# Patient Record
Sex: Female | Born: 1950 | Race: Black or African American | Hispanic: No | Marital: Married | Smoking: Never smoker
Health system: Southern US, Community
[De-identification: ages and names within clinical notes are randomized; demographics above are authoritative.]

## PROBLEM LIST (undated history)

## (undated) DIAGNOSIS — E0500 Thyrotoxicosis with diffuse goiter without thyrotoxic crisis or storm: Secondary | ICD-10-CM

## (undated) DIAGNOSIS — K219 Gastro-esophageal reflux disease without esophagitis: Secondary | ICD-10-CM

## (undated) DIAGNOSIS — M1990 Unspecified osteoarthritis, unspecified site: Secondary | ICD-10-CM

## (undated) DIAGNOSIS — F419 Anxiety disorder, unspecified: Secondary | ICD-10-CM

## (undated) DIAGNOSIS — I499 Cardiac arrhythmia, unspecified: Secondary | ICD-10-CM

## (undated) DIAGNOSIS — C801 Malignant (primary) neoplasm, unspecified: Secondary | ICD-10-CM

## (undated) DIAGNOSIS — K529 Noninfective gastroenteritis and colitis, unspecified: Secondary | ICD-10-CM

## (undated) DIAGNOSIS — E039 Hypothyroidism, unspecified: Secondary | ICD-10-CM

## (undated) DIAGNOSIS — R52 Pain, unspecified: Secondary | ICD-10-CM

## (undated) DIAGNOSIS — B029 Zoster without complications: Secondary | ICD-10-CM

## (undated) HISTORY — PX: ABDOMINAL HYSTERECTOMY: SHX81

## (undated) HISTORY — PX: OTHER SURGICAL HISTORY: SHX169

## (undated) HISTORY — PX: PORTACATH PLACEMENT: SHX2246

## (undated) HISTORY — PX: TONSILLECTOMY: SUR1361

---

## 2012-11-10 HISTORY — PX: VIDEO ASSISTED THORACOSCOPY (VATS)/WEDGE RESECTION: SHX6174

## 2013-01-10 DIAGNOSIS — B029 Zoster without complications: Secondary | ICD-10-CM

## 2013-01-10 HISTORY — DX: Zoster without complications: B02.9

## 2013-02-14 ENCOUNTER — Other Ambulatory Visit (HOSPITAL_COMMUNITY): Payer: Self-pay | Admitting: Internal Medicine

## 2013-02-14 DIAGNOSIS — C3490 Malignant neoplasm of unspecified part of unspecified bronchus or lung: Secondary | ICD-10-CM

## 2013-02-15 ENCOUNTER — Ambulatory Visit (HOSPITAL_COMMUNITY)
Admission: RE | Admit: 2013-02-15 | Discharge: 2013-02-15 | Disposition: A | Discharge status: Home / Self Care | Payer: BC Managed Care – PPO | Source: Ambulatory Visit | Attending: Internal Medicine | Admitting: Internal Medicine

## 2013-02-15 ENCOUNTER — Other Ambulatory Visit (HOSPITAL_COMMUNITY): Payer: Self-pay | Admitting: Internal Medicine

## 2013-02-15 ENCOUNTER — Other Ambulatory Visit: Payer: Self-pay

## 2013-02-15 ENCOUNTER — Encounter (HOSPITAL_COMMUNITY): Payer: Self-pay

## 2013-02-15 DIAGNOSIS — Z79899 Other long term (current) drug therapy: Secondary | ICD-10-CM | POA: Insufficient documentation

## 2013-02-15 DIAGNOSIS — Z90710 Acquired absence of both cervix and uterus: Secondary | ICD-10-CM | POA: Insufficient documentation

## 2013-02-15 DIAGNOSIS — R000 Tachycardia, unspecified: Secondary | ICD-10-CM | POA: Insufficient documentation

## 2013-02-15 DIAGNOSIS — Z9089 Acquired absence of other organs: Secondary | ICD-10-CM | POA: Insufficient documentation

## 2013-02-15 DIAGNOSIS — C3490 Malignant neoplasm of unspecified part of unspecified bronchus or lung: Secondary | ICD-10-CM

## 2013-02-15 HISTORY — DX: Malignant (primary) neoplasm, unspecified: C80.1

## 2013-02-15 LAB — CBC WITH DIFFERENTIAL/PLATELET
Basophils Absolute: 0 10*3/uL (ref 0.0–0.1)
Basophils Relative: 0 % (ref 0–1)
EOS PCT: 3 % (ref 0–5)
Eosinophils Absolute: 0.1 10*3/uL (ref 0.0–0.7)
HEMATOCRIT: 35.4 % — AB (ref 36.0–46.0)
HEMOGLOBIN: 11.9 g/dL — AB (ref 12.0–15.0)
LYMPHS ABS: 2.1 10*3/uL (ref 0.7–4.0)
Lymphocytes Relative: 47 % — ABNORMAL HIGH (ref 12–46)
MCH: 29.4 pg (ref 26.0–34.0)
MCHC: 33.6 g/dL (ref 30.0–36.0)
MCV: 87.4 fL (ref 78.0–100.0)
MONOS PCT: 14 % — AB (ref 3–12)
Monocytes Absolute: 0.6 10*3/uL (ref 0.1–1.0)
NEUTROS PCT: 36 % — AB (ref 43–77)
Neutro Abs: 1.6 10*3/uL — ABNORMAL LOW (ref 1.7–7.7)
Platelets: 216 10*3/uL (ref 150–400)
RBC: 4.05 MIL/uL (ref 3.87–5.11)
RDW: 12.7 % (ref 11.5–15.5)
WBC: 4.4 10*3/uL (ref 4.0–10.5)

## 2013-02-15 LAB — PROTIME-INR
INR: 1.14 (ref 0.00–1.49)
PROTHROMBIN TIME: 14.4 s (ref 11.6–15.2)

## 2013-02-15 LAB — APTT: aPTT: 28 seconds (ref 24–37)

## 2013-02-15 MED ORDER — MIDAZOLAM HCL 2 MG/2ML IJ SOLN
INTRAMUSCULAR | Status: AC
  Filled 2013-02-15: qty 6

## 2013-02-15 MED ORDER — LIDOCAINE HCL 1 % IJ SOLN
INTRAMUSCULAR | Status: AC
  Filled 2013-02-15: qty 20

## 2013-02-15 MED ORDER — SODIUM CHLORIDE 0.9 % IV SOLN
INTRAVENOUS | Status: DC
  Administered 2013-02-15: 13:00:00 via INTRAVENOUS

## 2013-02-15 MED ORDER — FENTANYL CITRATE 0.05 MG/ML IJ SOLN
INTRAMUSCULAR | Status: AC | PRN
  Administered 2013-02-15 (×2): 25 ug via INTRAVENOUS
  Administered 2013-02-15: 50 ug via INTRAVENOUS

## 2013-02-15 MED ORDER — HEPARIN SOD (PORK) LOCK FLUSH 100 UNIT/ML IV SOLN
INTRAVENOUS | Status: AC
  Filled 2013-02-15: qty 5

## 2013-02-15 MED ORDER — MIDAZOLAM HCL 2 MG/2ML IJ SOLN
INTRAMUSCULAR | Status: AC | PRN
  Administered 2013-02-15: 1 mg via INTRAVENOUS
  Administered 2013-02-15 (×5): 0.5 mg via INTRAVENOUS

## 2013-02-15 MED ORDER — CEFAZOLIN SODIUM-DEXTROSE 2-3 GM-% IV SOLR
2.0000 g | INTRAVENOUS | Status: AC
  Administered 2013-02-15: 2 g via INTRAVENOUS
  Filled 2013-02-15: qty 50

## 2013-02-15 MED ORDER — HEPARIN SOD (PORK) LOCK FLUSH 100 UNIT/ML IV SOLN
INTRAVENOUS | Status: AC | PRN
  Administered 2013-02-15: 500 [IU]

## 2013-02-15 MED ORDER — FENTANYL CITRATE 0.05 MG/ML IJ SOLN
INTRAMUSCULAR | Status: AC
  Filled 2013-02-15: qty 6

## 2013-02-15 NOTE — Procedures (Signed)
Procedure:  Porta-cath placement Findings:  Right IJ single lumen Power Port placed.  Cath tip at cavoatrial junction.  No PTX.  OK to use.

## 2013-02-15 NOTE — H&P (Signed)
Chief Complaint: "I'm here for port placement" Referring Physician:Darovsky HPI: Katherine Ferguson is an 63 y.o. female with lung cancer. She has had surgical resection on the right side at [facility], but is going to start chemotherapy here. She does not report plans for radiation therapy. She is referred for port placement. PMHx and meds reviewed. No recent illness, fevers, chills. She has recovered well from surgery.  Past Medical History:  Past Medical History  Diagnosis Date  . Cancer     lung    Past Surgical History:  Past Surgical History  Procedure Laterality Date  . Abdominal hysterectomy    . Tonsillectomy    . Video assisted thoracoscopy (vats)/wedge resection Right 11/2012  . Mass removed  from back and neck      Family History: No family history on file.  Social History:  reports that she has never smoked. She does not have any smokeless tobacco history on file. She reports that she does not use illicit drugs. Her alcohol history is not on file.  Allergies: No Known Allergies  Medications:   Medication List    ASK your doctor about these medications       acetaminophen 500 MG tablet  Commonly known as:  TYLENOL  Take 500 mg by mouth every 6 (six) hours as needed for mild pain.     diazepam 5 MG tablet  Commonly known as:  VALIUM  Take 5 mg by mouth every 6 (six) hours as needed for anxiety.     oxycodone 5 MG capsule  Commonly known as:  OXY-IR  Take 5-10 mg by mouth every 4 (four) hours as needed for pain.        Please HPI for pertinent positives, otherwise complete 10 system ROS negative.  Physical Exam: BP 162/65  Pulse 132  Temp(Src) 98 F (36.7 C) (Oral)  Resp 20  Ht 5\' 2"  (1.575 m)  Wt 121 lb (54.885 kg)  BMI 22.13 kg/m2  SpO2 100% Body mass index is 22.13 kg/(m^2).   General Appearance:  Alert, cooperative, no distress, appears stated age  Head:  Normocephalic, without obvious abnormality, atraumatic  ENT: Unremarkable  Neck: Supple,  symmetrical, trachea midline  Lungs:   Clear to auscultation bilaterally, no w/r/r, respirations unlabored without use of accessory muscles.  Chest Wall:  No tenderness or deformity. Healed surgical scars from VATS  Heart:  Tachy but regular rate and rhythm, S1, S2 normal, no murmur, rub or gallop.  Abdomen:   Soft, non-tender, non distended.  Extremities: Extremities normal, atraumatic, no cyanosis or edema  Pulses: 2+ and symmetric  Neurologic: Normal affect, no gross deficits.   Results for orders placed during the hospital encounter of 02/15/13 (from the past 48 hour(s))  CBC WITH DIFFERENTIAL     Status: Abnormal   Collection Time    02/15/13  1:15 PM      Result Value Range   WBC 4.4  4.0 - 10.5 K/uL   RBC 4.05  3.87 - 5.11 MIL/uL   Hemoglobin 11.9 (*) 12.0 - 15.0 g/dL   HCT 35.4 (*) 36.0 - 46.0 %   MCV 87.4  78.0 - 100.0 fL   MCH 29.4  26.0 - 34.0 pg   MCHC 33.6  30.0 - 36.0 g/dL   RDW 12.7  11.5 - 15.5 %   Platelets 216  150 - 400 K/uL   Neutrophils Relative % 36 (*) 43 - 77 %   Neutro Abs 1.6 (*) 1.7 - 7.7 K/uL  Lymphocytes Relative 47 (*) 12 - 46 %   Lymphs Abs 2.1  0.7 - 4.0 K/uL   Monocytes Relative 14 (*) 3 - 12 %   Monocytes Absolute 0.6  0.1 - 1.0 K/uL   Eosinophils Relative 3  0 - 5 %   Eosinophils Absolute 0.1  0.0 - 0.7 K/uL   Basophils Relative 0  0 - 1 %   Basophils Absolute 0.0  0.0 - 0.1 K/uL  APTT     Status: None   Collection Time    02/15/13  1:15 PM      Result Value Range   aPTT 28  24 - 37 seconds  PROTIME-INR     Status: None   Collection Time    02/15/13  1:15 PM      Result Value Range   Prothrombin Time 14.4  11.6 - 15.2 seconds   INR 1.14  0.00 - 1.49   No results found.  Assessment/Plan Lung cancer For port placement Explained procedure, risks, complications, use of sedation. Labs pending Consent signed in chart  Ascencion Dike PA-C 02/15/2013, 1:42 PM

## 2013-02-15 NOTE — H&P (Signed)
Agree. For port placement today.

## 2013-02-15 NOTE — Discharge Instructions (Signed)
Implanted Port Home Guide °An implanted port is a type of central line that is placed under the skin. Central lines are used to provide IV access when treatment or nutrition needs to be given through a person's veins. Implanted ports are used for long-term IV access. An implanted port may be placed because:  °· You need IV medicine that would be irritating to the small veins in your hands or arms.   °· You need long-term IV medicines, such as antibiotics.   °· You need IV nutrition for a long period.   °· You need frequent blood draws for lab tests.   °· You need dialysis.   °Implanted ports are usually placed in the chest area, but they can also be placed in the upper arm, the abdomen, or the leg. An implanted port has two main parts:  °· Reservoir. The reservoir is round and will appear as a small, raised area under your skin. The reservoir is the part where a needle is inserted to give medicines or draw blood.   °· Catheter. The catheter is a thin, flexible tube that extends from the reservoir. The catheter is placed into a large vein. Medicine that is inserted into the reservoir goes into the catheter and then into the vein.   °HOW WILL I CARE FOR MY INCISION SITE? °Do not get the incision site wet. Bathe or shower as directed by your health care provider.  °HOW IS MY PORT ACCESSED? °Special steps must be taken to access the port:  °· Before the port is accessed, a numbing cream can be placed on the skin. This helps numb the skin over the port site.   °· Your health care provider uses a sterile technique to access the port. °· Your health care provider must put on a mask and sterile gloves. °· The skin over your port is cleaned carefully with an antiseptic and allowed to dry. °· The port is gently pinched between sterile gloves, and a needle is inserted into the port. °· Only "non-coring" port needles should be used to access the port. Once the port is accessed, a blood return should be checked. This helps  ensure that the port is in the vein and is not clogged.   °· If your port needs to remain accessed for a constant infusion, a clear (transparent) bandage will be placed over the needle site. The bandage and needle will need to be changed every week, or as directed by your health care provider.   °· Keep the bandage covering the needle clean and dry. Do not get it wet. Follow your health care provider's instructions on how to take a shower or bath while the port is accessed.   °· If your port does not need to stay accessed, no bandage is needed over the port.   °WHAT IS FLUSHING? °Flushing helps keep the port from getting clogged. Follow your health care provider's instructions on how and when to flush the port. Ports are usually flushed with saline solution or a medicine called heparin. The need for flushing will depend on how the port is used.  °· If the port is used for intermittent medicines or blood draws, the port will need to be flushed:   °· After medicines have been given.   °· After blood has been drawn.   °· As part of routine maintenance.   °· If a constant infusion is running, the port may not need to be flushed.   °HOW LONG WILL MY PORT STAY IMPLANTED? °The port can stay in for as long as your health care   provider thinks it is needed. When it is time for the port to come out, surgery will be done to remove it. The procedure is similar to the one performed when the port was put in.  °WHEN SHOULD I SEEK IMMEDIATE MEDICAL CARE? °When you have an implanted port, you should seek immediate medical care if:  °· You notice a bad smell coming from the incision site.   °· You have swelling, redness, or drainage at the incision site.   °· You have more swelling or pain at the port site or the surrounding area.   °· You have a fever that is not controlled with medicine. °Document Released: 12/27/2004 Document Revised: 10/17/2012 Document Reviewed: 09/03/2012 °ExitCare® Patient Information ©2014 [employer]  LL[employer]. °Moderate Sedation, Adult °Moderate sedation is given to help you relax or even sleep through a procedure. You may remain sleepy, be clumsy, or have poor balance for several hours following this procedure. Arrange for a responsible adult, family member, or friend to take you home. A responsible adult should stay with you for at least 24 hours or until the medicines have worn off. °· Do not participate in any activities where you could become injured for the next 24 hours, or until you feel normal again. Do not: °· Drive. °· Swim. °· Ride a bicycle. °· Operate heavy machinery. °· Cook. °· Use power tools. °· Climb ladders. °· Work at heights. °· Do not make important decisions or sign legal documents until you are improved. °· Vomiting may occur if you eat too soon. When you can drink without vomiting, try water, juice, or soup. Try solid foods if you feel little or no nausea. °· Only take over-the-counter or prescription medications for pain, discomfort, or fever as directed by your caregiver.If pain medications have been prescribed for you, ask your caregiver how soon it is safe to take them. °· Make sure you and your family fully understands everything about the medication given to you. Make sure you understand what side effects may occur. °· You should not drink alcohol, take sleeping pills, or medications that cause drowsiness for at least 24 hours. °· If you smoke, do not smoke alone. °· If you are feeling better, you may resume normal activities 24 hours after receiving sedation. °· Keep all appointments as scheduled. Follow all instructions. °· Ask questions if you do not understand. °SEEK MEDICAL CARE IF:  °· Your skin is pale or bluish in color. °· You continue to feel sick to your stomach (nauseous) or throw up (vomit). °· Your pain is getting worse and not helped by medication. °· You have bleeding or swelling. °· You are still sleepy or feeling clumsy after 24 hours. °SEEK IMMEDIATE MEDICAL CARE IF:   °· You develop a rash. °· You have difficulty breathing. °· You develop any type of allergic problem. °· You have a fever. °Document Released: 09/21/2000 Document Revised: 03/21/2011 Document Reviewed: 09/03/2012 °ExitCare® Patient Information ©2014 [employer]. ° °

## 2013-09-26 ENCOUNTER — Encounter (INDEPENDENT_AMBULATORY_CARE_PROVIDER_SITE_OTHER): Payer: Self-pay | Admitting: *Deleted

## 2013-10-01 ENCOUNTER — Ambulatory Visit (INDEPENDENT_AMBULATORY_CARE_PROVIDER_SITE_OTHER): Payer: Medicare Other | Admitting: Internal Medicine

## 2013-10-01 ENCOUNTER — Encounter (INDEPENDENT_AMBULATORY_CARE_PROVIDER_SITE_OTHER): Payer: Self-pay | Admitting: Internal Medicine

## 2013-10-01 VITALS — BP 122/66 | HR 68 | Temp 97.7°F | Resp 16 | Ht 62.0 in | Wt 119.1 lb

## 2013-10-01 DIAGNOSIS — R197 Diarrhea, unspecified: Secondary | ICD-10-CM

## 2013-10-01 DIAGNOSIS — K529 Noninfective gastroenteritis and colitis, unspecified: Secondary | ICD-10-CM

## 2013-10-01 DIAGNOSIS — E0500 Thyrotoxicosis with diffuse goiter without thyrotoxic crisis or storm: Secondary | ICD-10-CM | POA: Insufficient documentation

## 2013-10-01 NOTE — Patient Instructions (Signed)
Take Imodium OTC 2 mg by mouth daily. Stool diary until time of colonoscopy.

## 2013-10-01 NOTE — Progress Notes (Signed)
Presenting complaint;  Evaluation for chronic diarrhea.  History of present illness;  Patient is 63 year old [place] American female who is referred through the courtesy of Dr. Tressie Stalker for GI evaluation. Patient is under his care for metastatic lung carcinoma receiving chemotherapy as reviewed under past medical history. 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 Patient states all her problems began in November 2014. She began to have watery diarrhea with as many as 10-15 stools per day. She had multiple accidents. She experienced intermittent cramps. She did not have fever or chills melena or rectal bleeding. She also did not experience nausea or vomiting. There is no history of antibiotic use prior to onset of her diarrhea. There is also no history of travel abroad. Patient also has noted change in order to her stool and excessive flatus. Stool studies were negative. She was seen by Dr. Doristine Mango and had another stool C. difficile which was negative and she was advised to take Imodium along with Questran. She is using Questran twice daily and Imodium on as-needed basis and now having 5-6 watery to soft stools daily. She says she lost weight from 142 pounds down to 94 pounds. She was begun on Megace and has gained 25 pounds. Megace was discontinued after 2 months. She believes her weight loss was multi-factorial since she was also diagnosed with hypothyroidism and receiving chemotherapy for lung carcinoma. She also reports diarrhea getting worse 1 day after receiving chemotherapy and lasting for about 4 days before she is back to her baseline. He denies nausea vomiting heartburn or dysphagia. Patient states her bowels would move every other day prior to onset of her diarrhea. Last colonoscopy was 10 years ago by Dr. Valentino Saxon and was normal.   Current Medications: Outpatient Encounter Prescriptions as of 10/01/2013  Medication Sig  . acetaminophen (TYLENOL) 500 MG  tablet Take 500 mg by mouth every 6 (six) hours as needed for mild pain.  . calcium carbonate (OS-CAL) 600 MG TABS tablet Take 600 mg by mouth daily.   . Cholecalciferol (VITAMIN D) 2000 UNITS tablet Take 2,000 Units by mouth daily.   . cholestyramine (QUESTRAN) 4 GM/DOSE powder Take 4 g by mouth 2 (two) times daily with a meal.   . dexamethasone (DECADRON) 4 MG tablet Take 4 mg by mouth daily.   . diazepam (VALIUM) 5 MG tablet Take 5 mg by mouth every 6 (six) hours as needed for anxiety.  . folic acid (FOLVITE) 1 MG tablet Take 1 mg by mouth daily.   Marland Kitchen lidocaine-prilocaine (EMLA) cream Apply 1 application topically every 14 (fourteen) days.   Marland Kitchen loperamide (IMODIUM) 2 MG capsule Take 2 mg by mouth as needed.   . methimazole (TAPAZOLE) 10 MG tablet Take 5 mg by mouth daily. Take 1/2 tablet daily(5 mg)  . metoprolol (LOPRESSOR) 50 MG tablet Take by mouth 2 (two) times daily.   . Multiple Vitamin (THERA) TABS Take 1 tablet by mouth daily.   . naproxen sodium (ALEVE) 220 MG tablet Take 440 mg by mouth as needed.   Marland Kitchen oxyCODONE (OXY IR/ROXICODONE) 5 MG immediate release tablet Take 5 mg by mouth every 4 (four) hours as needed.   Marland Kitchen oxycodone (OXY-IR) 5 MG capsule Take 5-10 mg by mouth every 4 (four) hours as needed for pain.  . potassium chloride (K-DUR) 10 MEQ tablet Take 20 mEq by mouth 2 (two) times daily.   . [DISCONTINUED] diazepam (VALIUM) 5 MG tablet Take 5 mg by mouth.   Past  medical history; History of osteoporosis. Thyrotoxicosis or Graves' disease. She is under care of Dr. Alton Revere of [facility] endocrinology. Metastatic lung carcinoma. She initially presented in November 2014. Tissue diagnosis was made at [facility] by lung biopsy. She received 6 cycles of carboplatinum and pemetrexed. After initial response disease became progressive and she was switched to Novilumab every 6 weeks and she's had 4 cycles. Status post hysterectomy.  Allergies; No Known Allergies  Family history; Father died of  pancreatic carcinoma at age 75 within 2 weeks of diagnosis. Mother died of ovarian cancer at age 52. She has 2 brothers and they're both in good health(66and 24).  Social history; Patient is married and accompanied by her husband today. 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 She has 2 grownup children in good health. She does not smoke cigarettes or drink alcohol.   Physical examination; Blood pressure 122/66, pulse 68, temperature 97.7 F (36.5 C), temperature source Oral, resp. rate 16, height 5\' 2"  (1.575 m), weight 119 lb 1.6 oz (54.023 kg). Patient is alert and in no acute distress. Conjunctiva is pink. Sclera is nonicteric Oropharyngeal mucosa is normal. No neck masses or thyromegaly noted. Cardiac exam with regular rhythm normal S1 and S2. No murmur or gallop noted. Lungs are clear to auscultation. Abdomen is symmetrical. Bowel sounds are hyperactive. No bruits noted. On palpation abdomen is soft and nontender without organomegaly or masses. No LE edema or clubbing noted.  Labs/studies Results: Lab data from 09/17/2013. WBC 6.5, H&H 12.4 and 37.5 and platelet count 236K  Serum sodium 136, potassium 3.8, chloride 103, CO2 25, serum magnesium 1.8.  Glucose 146, BUN 10 and creatinine 0.73  Bilirubin 0.4, AP 113, AST 27, ALT 29, total protein 7.1 and albumin of 4.4 and serum calcium 9.3. TSH 2.70  FT4 0.77  Serum amylase 68 and lipase 27   Abdominopelvic CT from 07/29/2013 without contrast revealed no abnormality to pancreas or liver. No wall thickening noted the small or large bowel. There was focal fatty area in anterior abdominal wall on the right side felt to be small intramuscular lipoma   Chest CT on 09/10/2013 revealed bilateral pulmonary metastatic disease  With a minimal progression since prior study of 07/16/2013 along with new bone lesions involving several thoracic vertebral bodies.   Assessment:  Patient is 63 year old African  female who presents with 10 month history of nonbloody diarrhea with negative stool studies and partial symptomatic improvement with Questran and loperamide. Patient's illness also been complicated by metastatic adenocarcinoma which is felt to be from lung primary as well as history of thyrotoxicosis controlled with methimazole. There has been significant weight loss but it cannot be attributed solely to diarrhea. Diarrhea does not have features of malabsorptive syndrome. She could have paraneoplastic syndrome but first need to rule out conditions like microscopic or collagenous colitis. She may also need EGD with small bowel biopsy if colonoscopy is normal. If endoscopic evaluation is unremarkable she may need stool studies to determine if she has secretive diarrhea.  Recommendations;  Patient advised to take loperamide 2 mg by mouth every morning rather than on when necessary basis. Will schedule patient for colonoscopy and possible EGD in the near future. Further recommendations to follow.

## 2013-10-07 ENCOUNTER — Encounter (HOSPITAL_COMMUNITY): Payer: Self-pay | Admitting: Pharmacy Technician

## 2013-10-07 ENCOUNTER — Telehealth (INDEPENDENT_AMBULATORY_CARE_PROVIDER_SITE_OTHER): Payer: Self-pay | Admitting: *Deleted

## 2013-10-07 ENCOUNTER — Other Ambulatory Visit (INDEPENDENT_AMBULATORY_CARE_PROVIDER_SITE_OTHER): Payer: Self-pay | Admitting: *Deleted

## 2013-10-07 DIAGNOSIS — R197 Diarrhea, unspecified: Secondary | ICD-10-CM

## 2013-10-07 DIAGNOSIS — Z1211 Encounter for screening for malignant neoplasm of colon: Secondary | ICD-10-CM

## 2013-10-07 MED ORDER — PEG-KCL-NACL-NASULF-NA ASC-C 100 G PO SOLR: 1.0000 | Freq: Once | ORAL | Status: DC

## 2013-10-07 NOTE — Telephone Encounter (Signed)
Patient needs movi prep 

## 2013-10-09 ENCOUNTER — Telehealth (INDEPENDENT_AMBULATORY_CARE_PROVIDER_SITE_OTHER): Payer: Self-pay | Admitting: *Deleted

## 2013-10-09 NOTE — Telephone Encounter (Signed)
Patient aware.

## 2013-10-09 NOTE — Telephone Encounter (Signed)
Patient wants to know if she needs to continue to take the Imodium & Cholestyramine for her diarrhea -- she couldn't remember if you told anything about theses medications at the office visit -- please advise

## 2013-10-09 NOTE — Telephone Encounter (Signed)
Patient can hold these medications when she takes the prep day before. Would start her back on these meds day of procedure.

## 2013-10-11 ENCOUNTER — Ambulatory Visit (HOSPITAL_COMMUNITY)
Admission: RE | Admit: 2013-10-11 | Discharge: 2013-10-11 | Disposition: A | Discharge status: Home / Self Care | Payer: Medicare Other | Source: Ambulatory Visit | Attending: Internal Medicine | Admitting: Internal Medicine

## 2013-10-11 ENCOUNTER — Encounter (HOSPITAL_COMMUNITY)
Admission: RE | Disposition: A | Discharge status: Home / Self Care | Payer: Self-pay | Source: Ambulatory Visit | Attending: Internal Medicine

## 2013-10-11 ENCOUNTER — Encounter (HOSPITAL_COMMUNITY): Payer: Self-pay

## 2013-10-11 DIAGNOSIS — R634 Abnormal weight loss: Secondary | ICD-10-CM | POA: Insufficient documentation

## 2013-10-11 DIAGNOSIS — K635 Polyp of colon: Secondary | ICD-10-CM

## 2013-10-11 DIAGNOSIS — R197 Diarrhea, unspecified: Secondary | ICD-10-CM | POA: Insufficient documentation

## 2013-10-11 DIAGNOSIS — C3490 Malignant neoplasm of unspecified part of unspecified bronchus or lung: Secondary | ICD-10-CM

## 2013-10-11 DIAGNOSIS — Z79899 Other long term (current) drug therapy: Secondary | ICD-10-CM | POA: Diagnosis not present

## 2013-10-11 DIAGNOSIS — K449 Diaphragmatic hernia without obstruction or gangrene: Secondary | ICD-10-CM | POA: Diagnosis not present

## 2013-10-11 DIAGNOSIS — K529 Noninfective gastroenteritis and colitis, unspecified: Secondary | ICD-10-CM

## 2013-10-11 DIAGNOSIS — K2210 Ulcer of esophagus without bleeding: Secondary | ICD-10-CM | POA: Insufficient documentation

## 2013-10-11 DIAGNOSIS — D123 Benign neoplasm of transverse colon: Secondary | ICD-10-CM | POA: Insufficient documentation

## 2013-10-11 HISTORY — PX: COLONOSCOPY: SHX5424

## 2013-10-11 HISTORY — PX: ESOPHAGOGASTRODUODENOSCOPY: SHX5428

## 2013-10-11 SURGERY — COLONOSCOPY
Anesthesia: Moderate Sedation

## 2013-10-11 MED ORDER — MIDAZOLAM HCL 5 MG/5ML IJ SOLN
INTRAMUSCULAR | Status: AC
  Filled 2013-10-11: qty 10

## 2013-10-11 MED ORDER — MEPERIDINE HCL 50 MG/ML IJ SOLN
INTRAMUSCULAR | Status: DC | PRN
  Administered 2013-10-11 (×2): 25 mg via INTRAVENOUS

## 2013-10-11 MED ORDER — MIDAZOLAM HCL 5 MG/5ML IJ SOLN
INTRAMUSCULAR | Status: DC | PRN
  Administered 2013-10-11 (×2): 1 mg via INTRAVENOUS
  Administered 2013-10-11: 2 mg via INTRAVENOUS
  Administered 2013-10-11: 1 mg via INTRAVENOUS

## 2013-10-11 MED ORDER — SODIUM CHLORIDE 0.9 % IV SOLN
INTRAVENOUS | Status: DC
  Administered 2013-10-11: 12:00:00 via INTRAVENOUS

## 2013-10-11 MED ORDER — BUTAMBEN-TETRACAINE-BENZOCAINE 2-2-14 % EX AERO
INHALATION_SPRAY | CUTANEOUS | Status: DC | PRN
  Administered 2013-10-11: 1 via TOPICAL

## 2013-10-11 MED ORDER — STERILE WATER FOR IRRIGATION IR SOLN
Status: DC | PRN
  Administered 2013-10-11: 12:00:00

## 2013-10-11 MED ORDER — MEPERIDINE HCL 50 MG/ML IJ SOLN
INTRAMUSCULAR | Status: AC
  Filled 2013-10-11: qty 1

## 2013-10-11 MED ORDER — LOPERAMIDE HCL 2 MG PO CAPS: 2.0000 mg | ORAL_CAPSULE | Freq: Two times a day (BID) | ORAL | Status: DC

## 2013-10-11 MED ORDER — PANTOPRAZOLE SODIUM 40 MG PO TBEC: 40.0000 mg | DELAYED_RELEASE_TABLET | Freq: Every day | ORAL | Status: AC

## 2013-10-11 NOTE — Op Note (Signed)
[facility] [address] [place], [address]   COLONOSCOPY PROCEDURE REPORT     EXAM DATE: 10/23/2013  PATIENT NAME:      Katherine Ferguson, Katherine Ferguson           MR #:      [id number]  BIRTHDATE:       1950/07/17      VISIT #:     [id number]  ATTENDING:     Hildred Laser, MD     STATUS:     outpatient REFERRING MD:      Everardo All, M.D. ASA CLASS:        Class I  INDICATIONS:  The patient is a 63 yr old female here for a colonoscopy due to chronic diarrhea and weight loss.  patient has adenocarcinoma of the lung and is undergoing therapy. PROCEDURE PERFORMED:     Colonoscopy, diagnostic and Colonoscopy with biopsy MEDICATIONS:        None other than what was given for EGD. ESTIMATED BLOOD LOSS:     None  CONSENT: The patient understands the risks and benefits of the procedure and understands that these risks include, but are not limited to: sedation, allergic reaction, infection, perforation and/or bleeding. Alternative means of evaluation and treatment include, among others: physical exam, x-rays, and/or surgical intervention. The patient elects to proceed with this endoscopic procedure.  DESCRIPTION OF PROCEDURE: During intra-op preparation period all mechanical & medical equipment was checked for proper function. Hand hygiene and appropriate measures for infection prevention was taken. After the risks, benefits and alternatives of the procedure were thoroughly explained, Informed consent was verified, confirmed and timeout was successfully executed by the treatment team. A digital exam revealed no abnormalities of the rectum.      The [id number] ([id number]) endoscope was introduced through the anus and advanced to the cecum, which was identified by both the appendix and ileocecal valve. No adverse events experienced. The prep was excellent.. The instrument was then slowly withdrawn as the colon was fully examined.   COLON FINDINGS: A smooth polyp measuring  4 mm in size was found at the splenic flexure.  Multiple biopsies were performed using cold forceps.   The examination was otherwise normal. thickened anoderm. The scope was then completely withdrawn from the patient and the procedure terminated. WITHDRAWAL TIME: 12 minutes 0 seconds    ADVERSE EVENTS:      There were no immediate complications.  IMPRESSIONS:     1.  Polyp was found at the splenic flexure; multiple biopsies were performed using cold forceps 2.  The examination was otherwise normal .the random biopsies taken from mucosa of sigmoid colon looking for microscopic or collagenous colitis.  RECOMMENDATIONS:     1.  Await biopsy results 2.  Increase loperamide 2 mg by mouth twice a day   Hildred Laser, MD eSigned:  Hildred Laser, MD 23-Oct-2013 4:23 PM   cc:  CPT CODES: ICD CODES:  The ICD and CPT codes recommended by this software are interpretations from the data that the clinical staff has captured with the software.  The verification of the translation of this report to the ICD and CPT codes and modifiers is the sole responsibility of the health care institution and practicing physician where this report was generated.  [employer] will not be held responsible for the validity of the ICD and CPT codes included on this report.  AMA assumes no liability for data contained or not contained herein. CPT is a registered  trademark of the [employer].

## 2013-10-11 NOTE — H&P (Signed)
Katherine Ferguson is an 63 y.o. female.   Chief Complaint: Patient is here for EGD and colonoscopy. HPI: Patient is 63 year old African female with history of metastatic lung carcinoma presents with chronic diarrhea. C. difficile has been negative. She has noted mild epigastric and right lower quadrant pain. She has not responded to Imodium. Diarrhea is not felt to be secondary to chemotherapy. Please refer to my possible from 10/01/2013 for details.  Past Medical History  Diagnosis Date  . Cancer     lung    Past Surgical History  Procedure Laterality Date  . Abdominal hysterectomy    . Tonsillectomy    . Video assisted thoracoscopy (vats)/wedge resection Right 11/2012  . Mass removed  from back and neck      History reviewed. No pertinent family history. Social History:  reports that she has never smoked. She has never used smokeless tobacco. She reports that she does not drink alcohol or use illicit drugs.  Allergies:  Allergies  Allergen Reactions  . Iodine   . Iodides Rash    Medications Prior to Admission  Medication Sig Dispense Refill  . calcium carbonate (OS-CAL) 600 MG TABS tablet Take 600 mg by mouth daily.       . Cholecalciferol (VITAMIN D) 2000 UNITS tablet Take 2,000 Units by mouth daily.       . cholestyramine (QUESTRAN) 4 GM/DOSE powder Take 4 g by mouth 2 (two) times daily with a meal.       . Cyanocobalamin (VITAMIN B-12 IJ) Inject 1 inch as directed.      Marland Kitchen dexamethasone (DECADRON) 4 MG tablet Take 4 mg by mouth 2 (two) times daily. Take 2 days before, the day of, and 2 days after chemo.      . diazepam (VALIUM) 5 MG tablet Take 5 mg by mouth every 6 (six) hours as needed for anxiety.      . folic acid (FOLVITE) 1 MG tablet Take 1 mg by mouth daily.       Marland Kitchen lidocaine-prilocaine (EMLA) cream Apply 1 application topically every 14 (fourteen) days.       Marland Kitchen loperamide (IMODIUM) 2 MG capsule Take 2 mg by mouth as needed for diarrhea or loose stools.       Marland Kitchen  LORazepam (ATIVAN) 0.5 MG tablet Take 0.5 mg by mouth every 8 (eight) hours as needed for anxiety.      . methimazole (TAPAZOLE) 10 MG tablet Take 5 mg by mouth daily.       . metoprolol (LOPRESSOR) 50 MG tablet Take 50 mg by mouth 2 (two) times daily.       . Multiple Vitamin (THERA) TABS Take 1 tablet by mouth daily.       Marland Kitchen oxycodone (OXY-IR) 5 MG capsule Take 5-10 mg by mouth every 4 (four) hours as needed for pain.      . peg 3350 powder (MOVIPREP) 100 G SOLR Take 1 kit (200 g total) by mouth once.  1 kit  0  . potassium chloride (K-DUR) 10 MEQ tablet Take 20 mEq by mouth 4 (four) times daily.         No results found for this or any previous visit (from the past 48 hour(s)). No results found.  ROS  Blood pressure 138/73, pulse 72, temperature 97.7 F (36.5 C), temperature source Oral, resp. rate 21, SpO2 99.00%. Physical Exam  Constitutional: She appears well-developed and well-nourished.  HENT:  Mouth/Throat: Oropharynx is clear and moist.  Eyes: Conjunctivae are  normal. No scleral icterus.  Neck: No thyromegaly present.  Cardiovascular: Normal rate, regular rhythm and normal heart sounds.   No murmur heard. Respiratory: Effort normal and breath sounds normal.  GI: Soft. Tenderness: mild midepigastric and right lower quadrant tenderness.  Musculoskeletal: She exhibits no edema.  Lymphadenopathy:    She has no cervical adenopathy.  Neurological: She is alert.  Skin: Skin is warm and dry.     Assessment/Plan Chronic diarrhea with weight loss. [name]. Diagnostic EGD and colonoscopy.  KatherineNAJEEB Ferguson 10/11/2013, 12:07 PM

## 2013-10-11 NOTE — Discharge Instructions (Signed)
Resume usual medications but increase Imodium to 2 mg before breakfast and lunch daily. Pantoprazole 40 mg by mouth 30 minutes before breakfast daily. Resume usual diet. No driving for 36-UYQIH. Physician will call with results of blood test and biopsy.    Colonoscopy, Care After Refer to this sheet in the next few weeks. These instructions provide you with information on caring for yourself after your procedure. Your health care provider may also give you more specific instructions. Your treatment has been planned according to current medical practices, but problems sometimes occur. Call your health care provider if you have any problems or questions after your procedure. WHAT TO EXPECT AFTER THE PROCEDURE  After your procedure, it is typical to have the following:  A small amount of blood in your stool.  Moderate amounts of gas and mild abdominal cramping or bloating. HOME CARE INSTRUCTIONS  Do not drive, operate machinery, or sign important documents for 24 hours.  You may shower and resume your regular physical activities, but move at a slower pace for the first 24 hours.  Take frequent rest periods for the first 24 hours.  Walk around or put a warm pack on your abdomen to help reduce abdominal cramping and bloating.  Drink enough fluids to keep your urine clear or pale yellow.  You may resume your normal diet as instructed by your health care provider. Avoid heavy or fried foods that are hard to digest.  Avoid drinking alcohol for 24 hours or as instructed by your health care provider.  Only take over-the-counter or prescription medicines as directed by your health care provider.  If a tissue sample (biopsy) was taken during your procedure:  Do not take aspirin or blood thinners for 7 days, or as instructed by your health care provider.  Do not drink alcohol for 7 days, or as instructed by your health care provider.  Eat soft foods for the first 24 hours. SEEK MEDICAL  CARE IF: You have persistent spotting of blood in your stool 2-3 days after the procedure. SEEK IMMEDIATE MEDICAL CARE IF:  You have more than a small spotting of blood in your stool.  You pass large blood clots in your stool.  Your abdomen is swollen (distended).  You have nausea or vomiting.  You have a fever.  You have increasing abdominal pain that is not relieved with medicine. Document Released: 08/11/2003 Document Revised: 10/17/2012 Document Reviewed: 09/03/2012 [facility] Patient Information 2015 [place], [place]. This information is not intended to replace advice given to you by your health care provider. Make sure you discuss any questions you have with your health care provider.   Colon Polyps Polyps are lumps of extra tissue growing inside the body. Polyps can grow in the large intestine (colon). Most colon polyps are noncancerous (benign). However, some colon polyps can become cancerous over time. Polyps that are larger than a pea may be harmful. To be safe, caregivers remove and test all polyps. CAUSES  Polyps form when mutations in the genes cause your cells to grow and divide even though no more tissue is needed. RISK FACTORS There are a number of risk factors that can increase your chances of getting colon polyps. They include:  Being older than 50 years.  Family history of colon polyps or colon cancer.  Long-term colon diseases, such as colitis or Crohn disease.  Being overweight.  Smoking.  Being inactive.  Drinking too much alcohol. SYMPTOMS  Most small polyps do not cause symptoms. If symptoms are present,  they may include:  Blood in the stool. The stool may look dark red or black.  Constipation or diarrhea that lasts longer than 1 week. DIAGNOSIS People often do not know they have polyps until their caregiver finds them during a regular checkup. Your caregiver can use 4 tests to check for polyps:  Digital rectal exam. The caregiver wears gloves and  feels inside the rectum. This test would find polyps only in the rectum.  Barium enema. The caregiver puts a liquid called barium into your rectum before taking X-rays of your colon. Barium makes your colon look white. Polyps are dark, so they are easy to see in the X-ray pictures.  Sigmoidoscopy. A thin, flexible tube (sigmoidoscope) is placed into your rectum. The sigmoidoscope has a light and tiny camera in it. The caregiver uses the sigmoidoscope to look at the last third of your colon.  Colonoscopy. This test is like sigmoidoscopy, but the caregiver looks at the entire colon. This is the most common method for finding and removing polyps. TREATMENT  Any polyps will be removed during a sigmoidoscopy or colonoscopy. The polyps are then tested for cancer. PREVENTION  To help lower your risk of getting more colon polyps:  Eat plenty of fruits and vegetables. Avoid eating fatty foods.  Do not smoke.  Avoid drinking alcohol.  Exercise every day.  Lose weight if recommended by your caregiver.  Eat plenty of calcium and folate. Foods that are rich in calcium include milk, cheese, and broccoli. Foods that are rich in folate include chickpeas, kidney beans, and spinach. HOME CARE INSTRUCTIONS Keep all follow-up appointments as directed by your caregiver. You may need periodic exams to check for polyps. SEEK MEDICAL CARE IF: You notice bleeding during a bowel movement. Document Released: 09/23/2003 Document Revised: 03/21/2011 Document Reviewed: 03/08/2011 [facility]: [facility] Patient Information 2015 [place], [place]. This information is not intended to replace advice given to you by your health care provider. Make sure you discuss any questions you have with your health care provider.

## 2013-10-11 NOTE — Op Note (Addendum)
[facility] [address] [place], [address]   ENDOSCOPY PROCEDURE REPORT  PATIENT: Katherine Ferguson, Katherine Ferguson  MR#: [id number] BIRTHDATE: 1950-05-11 , 21  yrs. old GENDER: female ENDOSCOPIST: Hildred Laser, MD REFERRED BY:  Everardo All, M.D. PROCEDURE DATE:  10/22/2013 PROCEDURE:  EGD, diagnostic and EGD w/ biopsy ASA CLASS:     Class I INDICATIONS:  Chronic diarrhea, weight loss. Patient has metastatic adenocarcinoma of the lung and is undergoing chemotherapy. issues diarrhea started back in November 2014 long before she didn't chemotherapy.Marland Kitchen MEDICATIONS: Cetacaine spray for oral pharyngeal topical anesthesia, Meperidine (Demerol) 50 mg IV, and Versed 5 mg IV TOPICAL ANESTHETIC: Cetacaine Spray  DESCRIPTION OF PROCEDURE: After the risks benefits and alternatives of the procedure were thoroughly explained, informed consent was obtained.  The EG-[id number] ([id number]) endoscope was introduced through the mouth and advanced to the second portion of the duodenum , Without limitations.  The instrument was slowly withdrawn as the mucosa was fully examined.    ESOPHAGUS: Small erosion(s) were found at the gastroesophageal junction.  GE junction was located at 34 cm from the incisors and hiatus was at 36.  STOMACH: Three small erosions were found in the gastric antrum. pyloric channel was patent. Angularis fundus and cardia were normal.  DUODENUM: The duodenal mucosa showed no abnormalities in the bulb and 2nd part of the duodenum.   Cold forcep biopsies were taken in the second portion.         The scope was then withdrawn from the patient and the procedure completed.  COMPLICATIONS: There were no immediate complications.  ENDOSCOPIC IMPRESSION: 1.   Erosive esophagitis 2.   Hiatus hernia 3.   Multiple antral erosions 4.   Random biopsies taken from post bulbar mucosa.  RECOMMENDATIONS: 1.  Proceed with a Colonoscopy. 2.  Await biopsy results 3.  Helicobacter pylori  serology.  REPEAT EXAM:  eSignedHildred Laser, MD 22-Oct-2013 4:09 PM Revised: Oct 22, 2013 4:09 PM   CC:  CPT CODES: ICD CODES:  The ICD and CPT codes recommended by this software are interpretations from the data that the clinical staff has captured with the software.  The verification of the translation of this report to the ICD and CPT codes and modifiers is the sole responsibility of the health care institution and practicing physician where this report was generated.  [employer] will not be held responsible for the validity of the ICD and CPT codes included on this report.  AMA assumes no liability for data contained or not contained herein. CPT is a Designer, television/film set of the [employer].  PATIENT NAME:  Abiha, Lukehart MR#: [id number]

## 2013-10-14 ENCOUNTER — Encounter (HOSPITAL_COMMUNITY): Payer: Self-pay | Admitting: Internal Medicine

## 2013-10-14 LAB — H. PYLORI ANTIBODY, IGG: H Pylori IgG: <0.4 {ISR}

## 2013-10-26 ENCOUNTER — Other Ambulatory Visit (HOSPITAL_COMMUNITY): Payer: Self-pay | Admitting: Internal Medicine

## 2013-11-05 ENCOUNTER — Encounter (INDEPENDENT_AMBULATORY_CARE_PROVIDER_SITE_OTHER): Payer: Self-pay | Admitting: *Deleted

## 2013-11-11 ENCOUNTER — Encounter (INDEPENDENT_AMBULATORY_CARE_PROVIDER_SITE_OTHER): Payer: Self-pay | Admitting: Internal Medicine

## 2013-11-11 ENCOUNTER — Ambulatory Visit (INDEPENDENT_AMBULATORY_CARE_PROVIDER_SITE_OTHER): Payer: Medicare Other | Admitting: Internal Medicine

## 2013-11-11 VITALS — BP 120/70 | HR 68 | Temp 98.6°F | Resp 18 | Ht 62.0 in | Wt 120.6 lb

## 2013-11-11 DIAGNOSIS — K8689 Other specified diseases of pancreas: Secondary | ICD-10-CM | POA: Insufficient documentation

## 2013-11-11 DIAGNOSIS — K869 Disease of pancreas, unspecified: Secondary | ICD-10-CM

## 2013-11-11 DIAGNOSIS — D490 Neoplasm of unspecified behavior of digestive system: Secondary | ICD-10-CM

## 2013-11-11 DIAGNOSIS — K529 Noninfective gastroenteritis and colitis, unspecified: Secondary | ICD-10-CM

## 2013-11-11 MED ORDER — PANCRELIPASE (LIP-PROT-AMYL) 24000-76000 UNITS PO CPEP: ORAL_CAPSULE | ORAL | Status: DC

## 2013-11-11 NOTE — Progress Notes (Signed)
Presenting complaint;  Follow-up for diarrhea and weight loss.  Database;  Patient is 63 year old  American female who was evaluated on 10/01/2013 for evaluation of chronic diarrhea. Patient's illness began about one year ago when she was diagnosed with thyrotoxicosis and metastatic lung carcinoma and developed diarrhea around the same time.  Stool C. Difficile was negative.she did respond partially to symptomatic therapy. She underwent EGD and colonoscopy on 10/11/2013. EGD revealed erosive reflux esophagitis small sliding hiatal hernia and antral erosions. H. Pylori serology was negative and random biopsies from post bulbar mucosa were negative for mucosal disease. Colonoscopy revealed normal-appearing mucosa and small polyp at splenic flexure ablated via cold biopsy. 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 Patient was advised to continue Questran as before but loperamide dose was doubled and she was begun on pantoprazole. She was asked to keep stool diary until this office visit.  Subjective:  Patient now returns for scheduled visit accompanied by her husband. She states frequency of bowel movements has decreased significantly but she is not passing normal stools. Stool diary was reviewed with patient. She is having anywhere from 1-5 stools per day with average of 2 stools per day. She did have 2 days without a bowel movement. She is still having nocturnal diarrhea. She has not lost anymore weight. Her appetite is fair. She complains of vague upper abdominal and left-sided chest pain. She also has noted lump in right lower quadrant of her abdomen. She states she had chest CT about 2 weeks ago and lung nodule size has increased slightly which means therapy is not working. She complains of excessive gas and it is very foul-smelling. She is not having heartburn anymore.   Current  Medications: Outpatient Encounter Prescriptions as of 11/11/2013  Medication Sig  . calcium carbonate (OS-CAL) 600 MG TABS tablet Take 600 mg by mouth daily.   . Cholecalciferol (VITAMIN D) 2000 UNITS tablet Take 2,000 Units by mouth daily.   . cholestyramine (QUESTRAN) 4 GM/DOSE powder Take 4 g by mouth 2 (two) times daily with a meal.   . Cyanocobalamin (VITAMIN B-12 IJ) Inject 1 inch as directed.  Marland Kitchen dexamethasone (DECADRON) 4 MG tablet Take 4 mg by mouth 2 (two) times daily. Take 2 days before, the day of, and 2 days after chemo.  . diazepam (VALIUM) 5 MG tablet Take 5 mg by mouth every 6 (six) hours as needed for anxiety.  . diphenoxylate-atropine (LOMOTIL) 2.5-0.025 MG per tablet Take 1 tablet by mouth 2 (two) times daily.   . folic acid (FOLVITE) 1 MG tablet Take 1 mg by mouth daily.   Marland Kitchen lidocaine-prilocaine (EMLA) cream Apply 1 application topically every 14 (fourteen) days.   Marland Kitchen loperamide (IMODIUM) 2 MG capsule TAKE ONE CAPSULE BY MOUTH TWICE A DAY  . LORazepam (ATIVAN) 0.5 MG tablet Take 0.5 mg by mouth every 8 (eight) hours as needed for anxiety.  . methimazole (TAPAZOLE) 10 MG tablet Take 5 mg by mouth daily.   . metoprolol (LOPRESSOR) 50 MG tablet Take 50 mg by mouth 2 (two) times daily.   . Multiple Vitamin (THERA) TABS Take 1 tablet by mouth daily.   Marland Kitchen oxycodone (OXY-IR) 5 MG capsule Take 5-10 mg by mouth every 4 (four) hours as needed for pain.  . pantoprazole (PROTONIX) 40 MG tablet Take 1 tablet (40 mg total) by mouth daily before breakfast.  . potassium chloride (K-DUR) 10 MEQ tablet Take 20  mEq by mouth 4 (four) times daily.      Objective: Blood pressure 120/70, pulse 68, temperature 98.6 F (37 C), temperature source Oral, resp. rate 18, height 5\' 2"  (1.575 m), weight 120 lb 9.6 oz (54.704 kg). Patient is alert and in no acute distress. Conjunctiva is pink. Sclera is nonicteric Oropharyngeal mucosa is normal. No neck masses or thyromegaly noted. Cardiac exam with  regular rhythm normal S1 and S2. No murmur or gallop noted. Lungs are clear to auscultation. Abdomen is symmetrical. Bowel sounds are normal. She has soft subcutaneous mass in right lower quadrant just above the inguinal ligament about 4 cm in maximal length. It is soft and non-tender like rest of the abdomen without organomegaly or masses. No LE edema or clubbing noted.  Labs/studies Results: H pylori serology was negative. 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 Chest CT from 10/28/2013 reviewed; it reveals multiple pulmonary nodules heterogeneous appearance to head of pancreas which is enlarged and dilated pancreatic duct. CT from 07/29/2013 also reviewed; the study was without contrast but in retrospect small segment of pancreatic duct appears prominent.   Assessment:  #1. Chronic diarrhea. No evidence of endoscopic or microscopic colitis on recent colonoscopy. No evidence of mucosal disease on duodenal biopsy as well. Recent chest CT suggests mass in head of pancreas; therefore she could have pancreatic insufficiency resulting in diarrhea.pancreas will need to be further evaluated with pancreas protocol CT or MR. 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 #2.GERD.  Patient is on pantoprazole.   Plan:  Discontinue Questran. Continue Lomotil or diphenoxylate 1 tablet by mouth twice a day. Creon 24,000, 2 capsules each meal and one with snack. Samples given along with prescription. Will discuss with Dr. Abran Duke as to further workup regarding pancreatic abnormalities. Will call with progress report in 2 weeks.

## 2013-11-11 NOTE — Patient Instructions (Signed)
Please call office with progress report in 2 weeks. Pancreas protocol CT to be ordered after discussion with Dr. Tressie Stalker

## 2013-11-12 ENCOUNTER — Other Ambulatory Visit: Payer: Self-pay

## 2013-11-12 ENCOUNTER — Encounter (INDEPENDENT_AMBULATORY_CARE_PROVIDER_SITE_OTHER): Payer: Self-pay

## 2013-11-12 ENCOUNTER — Telehealth: Payer: Self-pay

## 2013-11-12 DIAGNOSIS — R932 Abnormal findings on diagnostic imaging of liver and biliary tract: Secondary | ICD-10-CM

## 2013-11-12 DIAGNOSIS — Z85118 Personal history of other malignant neoplasm of bronchus and lung: Secondary | ICD-10-CM

## 2013-11-12 NOTE — Telephone Encounter (Signed)
Left message on machine to call back with Dr Laural Golden and the pt she has been scheduled 11/21/13 730 am [facility]

## 2013-11-12 NOTE — Telephone Encounter (Signed)
-----   Message from Milus Banister, MD sent at 11/12/2013 11:47 AM EST ----- Regarding: RE: EUS w/ FNA Widely metastatic process in lungs, now a question of pancreatic neuroendocrine as etiology.  I do not see path reports for cell type of lung lesions.  CT scan chest 09/2013 suggested prominent main pancreatic duct.  CT scan abd around the same time showed no clear masses in pancreas but lack of IV contrast limits sensitivity.  Can you ask Dr. Olevia Perches office to forward copy of lung biopsy report. Also if she has had a CA 19-9 level checked, please have that sent.  I think a lot of her records are probably available through Dr. Jaclyn Prime office.    Please schedule for upper EUS, radial +/- linear, next Thursday (12th) with MAC sedation (7:30 case).  For abnormal pancreas and known metastatic cancer in lungs.  Ave this as a phone note, too. Thanks   ----- Message -----    From: Barron Alvine, CMA    Sent: 11/12/2013  10:56 AM      To: Milus Banister, MD Subject: Melton Alar: EUS w/ FNA                                   ----- Message -----    From: Worthy Keeler    Sent: 11/12/2013  10:40 AM      To: Barron Alvine, CMA Subject: EUS w/ FNA                                     Patient needs EUS w/ FNA for pancreatic head mass -- Dr Laural Golden would like it next week -- all info is in EPIC -- thanks

## 2013-11-13 ENCOUNTER — Encounter (INDEPENDENT_AMBULATORY_CARE_PROVIDER_SITE_OTHER): Payer: Self-pay

## 2013-11-13 ENCOUNTER — Encounter (HOSPITAL_COMMUNITY): Payer: Self-pay | Admitting: *Deleted

## 2013-11-13 NOTE — Telephone Encounter (Signed)
EUS scheduled, pt instructed and medications reviewed.  Patient instructions mailed to home.  Patient to call with any questions or concerns.  

## 2013-11-21 ENCOUNTER — Ambulatory Visit (HOSPITAL_COMMUNITY)
Admission: RE | Admit: 2013-11-21 | Discharge: 2013-11-21 | Disposition: A | Discharge status: Home / Self Care | Payer: Medicare Other | Source: Ambulatory Visit | Attending: Gastroenterology | Admitting: Gastroenterology

## 2013-11-21 ENCOUNTER — Ambulatory Visit (HOSPITAL_COMMUNITY): Payer: Medicare Other | Admitting: Certified Registered Nurse Anesthetist

## 2013-11-21 ENCOUNTER — Encounter (HOSPITAL_COMMUNITY): Payer: Self-pay | Admitting: *Deleted

## 2013-11-21 ENCOUNTER — Encounter (HOSPITAL_COMMUNITY)
Admission: RE | Disposition: A | Discharge status: Home / Self Care | Payer: Self-pay | Source: Ambulatory Visit | Attending: Gastroenterology

## 2013-11-21 DIAGNOSIS — Z85118 Personal history of other malignant neoplasm of bronchus and lung: Secondary | ICD-10-CM

## 2013-11-21 DIAGNOSIS — K838 Other specified diseases of biliary tract: Secondary | ICD-10-CM | POA: Diagnosis present

## 2013-11-21 DIAGNOSIS — C250 Malignant neoplasm of head of pancreas: Secondary | ICD-10-CM | POA: Diagnosis not present

## 2013-11-21 DIAGNOSIS — R197 Diarrhea, unspecified: Secondary | ICD-10-CM | POA: Insufficient documentation

## 2013-11-21 DIAGNOSIS — R932 Abnormal findings on diagnostic imaging of liver and biliary tract: Secondary | ICD-10-CM

## 2013-11-21 DIAGNOSIS — E0590 Thyrotoxicosis, unspecified without thyrotoxic crisis or storm: Secondary | ICD-10-CM | POA: Insufficient documentation

## 2013-11-21 DIAGNOSIS — E039 Hypothyroidism, unspecified: Secondary | ICD-10-CM | POA: Insufficient documentation

## 2013-11-21 DIAGNOSIS — F419 Anxiety disorder, unspecified: Secondary | ICD-10-CM | POA: Insufficient documentation

## 2013-11-21 DIAGNOSIS — K219 Gastro-esophageal reflux disease without esophagitis: Secondary | ICD-10-CM | POA: Diagnosis not present

## 2013-11-21 DIAGNOSIS — C7800 Secondary malignant neoplasm of unspecified lung: Secondary | ICD-10-CM | POA: Insufficient documentation

## 2013-11-21 HISTORY — PX: EUS: SHX5427

## 2013-11-21 HISTORY — DX: Thyrotoxicosis with diffuse goiter without thyrotoxic crisis or storm: E05.00

## 2013-11-21 HISTORY — DX: Zoster without complications: B02.9

## 2013-11-21 HISTORY — DX: Cardiac arrhythmia, unspecified: I49.9

## 2013-11-21 HISTORY — DX: Hypothyroidism, unspecified: E03.9

## 2013-11-21 HISTORY — DX: Anxiety disorder, unspecified: F41.9

## 2013-11-21 HISTORY — DX: Pain, unspecified: R52

## 2013-11-21 SURGERY — UPPER ENDOSCOPIC ULTRASOUND (EUS) LINEAR
Anesthesia: Monitor Anesthesia Care

## 2013-11-21 MED ORDER — PROPOFOL 10 MG/ML IV BOLUS
INTRAVENOUS | Status: DC | PRN
  Administered 2013-11-21 (×3): 40 mg via INTRAVENOUS
  Administered 2013-11-21: 20 mg via INTRAVENOUS
  Administered 2013-11-21 (×2): 40 mg via INTRAVENOUS
  Administered 2013-11-21: 20 mg via INTRAVENOUS
  Administered 2013-11-21: 40 mg via INTRAVENOUS

## 2013-11-21 MED ORDER — BUTAMBEN-TETRACAINE-BENZOCAINE 2-2-14 % EX AERO
INHALATION_SPRAY | CUTANEOUS | Status: DC | PRN
  Administered 2013-11-21: 2 via TOPICAL

## 2013-11-21 MED ORDER — PROPOFOL 10 MG/ML IV BOLUS
INTRAVENOUS | Status: AC
Start: 2013-11-21 — End: 2013-11-21
  Filled 2013-11-21: qty 20

## 2013-11-21 MED ORDER — SODIUM CHLORIDE 0.9 % IV SOLN: INTRAVENOUS | Status: DC

## 2013-11-21 MED ORDER — LACTATED RINGERS IV SOLN
INTRAVENOUS | Status: DC | PRN
  Administered 2013-11-21: 07:00:00 via INTRAVENOUS

## 2013-11-21 MED ORDER — PROPOFOL 10 MG/ML IV BOLUS
INTRAVENOUS | Status: AC
  Filled 2013-11-21: qty 20

## 2013-11-21 NOTE — H&P (View-Only) (Signed)
Presenting complaint;  Follow-up for diarrhea and weight loss.  Database;  Patient is 62 year old  American female who was evaluated on 10/01/2013 for evaluation of chronic diarrhea. Patient's illness began about one year ago when she was diagnosed with thyrotoxicosis and metastatic lung carcinoma and developed diarrhea around the same time.  Stool C. Difficile was negative.she did respond partially to symptomatic therapy. She underwent EGD and colonoscopy on 10/11/2013. EGD revealed erosive reflux esophagitis small sliding hiatal hernia and antral erosions. H. Pylori serology was negative and random biopsies from post bulbar mucosa were negative for mucosal disease. Colonoscopy revealed normal-appearing mucosa and small polyp at splenic flexure ablated via cold biopsy. 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 Patient was advised to continue Questran as before but loperamide dose was doubled and she was begun on pantoprazole. She was asked to keep stool diary until this office visit.  Subjective:  Patient now returns for scheduled visit accompanied by her husband. She states frequency of bowel movements has decreased significantly but she is not passing normal stools. Stool diary was reviewed with patient. She is having anywhere from 1-5 stools per day with average of 2 stools per day. She did have 2 days without a bowel movement. She is still having nocturnal diarrhea. She has not lost anymore weight. Her appetite is fair. She complains of vague upper abdominal and left-sided chest pain. She also has noted lump in right lower quadrant of her abdomen. She states she had chest CT about 2 weeks ago and lung nodule size has increased slightly which means therapy is not working. She complains of excessive gas and it is very foul-smelling. She is not having heartburn anymore.   Current  Medications: Outpatient Encounter Prescriptions as of 11/11/2013  Medication Sig  . calcium carbonate (OS-CAL) 600 MG TABS tablet Take 600 mg by mouth daily.   . Cholecalciferol (VITAMIN D) 2000 UNITS tablet Take 2,000 Units by mouth daily.   . cholestyramine (QUESTRAN) 4 GM/DOSE powder Take 4 g by mouth 2 (two) times daily with a meal.   . Cyanocobalamin (VITAMIN B-12 IJ) Inject 1 inch as directed.  Marland Kitchen dexamethasone (DECADRON) 4 MG tablet Take 4 mg by mouth 2 (two) times daily. Take 2 days before, the day of, and 2 days after chemo.  . diazepam (VALIUM) 5 MG tablet Take 5 mg by mouth every 6 (six) hours as needed for anxiety.  . diphenoxylate-atropine (LOMOTIL) 2.5-0.025 MG per tablet Take 1 tablet by mouth 2 (two) times daily.   . folic acid (FOLVITE) 1 MG tablet Take 1 mg by mouth daily.   Marland Kitchen lidocaine-prilocaine (EMLA) cream Apply 1 application topically every 14 (fourteen) days.   Marland Kitchen loperamide (IMODIUM) 2 MG capsule TAKE ONE CAPSULE BY MOUTH TWICE A DAY  . LORazepam (ATIVAN) 0.5 MG tablet Take 0.5 mg by mouth every 8 (eight) hours as needed for anxiety.  . methimazole (TAPAZOLE) 10 MG tablet Take 5 mg by mouth daily.   . metoprolol (LOPRESSOR) 50 MG tablet Take 50 mg by mouth 2 (two) times daily.   . Multiple Vitamin (THERA) TABS Take 1 tablet by mouth daily.   Marland Kitchen oxycodone (OXY-IR) 5 MG capsule Take 5-10 mg by mouth every 4 (four) hours as needed for pain.  . pantoprazole (PROTONIX) 40 MG tablet Take 1 tablet (40 mg total) by mouth daily before breakfast.  . potassium chloride (K-DUR) 10 MEQ tablet Take 20  mEq by mouth 4 (four) times daily.      Objective: Blood pressure 120/70, pulse 68, temperature 98.6 F (37 C), temperature source Oral, resp. rate 18, height 5\' 2"  (1.575 m), weight 120 lb 9.6 oz (54.704 kg). Patient is alert and in no acute distress. Conjunctiva is pink. Sclera is nonicteric Oropharyngeal mucosa is normal. No neck masses or thyromegaly noted. Cardiac exam with  regular rhythm normal S1 and S2. No murmur or gallop noted. Lungs are clear to auscultation. Abdomen is symmetrical. Bowel sounds are normal. She has soft subcutaneous mass in right lower quadrant just above the inguinal ligament about 4 cm in maximal length. It is soft and non-tender like rest of the abdomen without organomegaly or masses. No LE edema or clubbing noted.  Labs/studies Results: H pylori serology was negative. 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 Chest CT from 10/28/2013 reviewed; it reveals multiple pulmonary nodules heterogeneous appearance to head of pancreas which is enlarged and dilated pancreatic duct. CT from 07/29/2013 also reviewed; the study was without contrast but in retrospect small segment of pancreatic duct appears prominent.   Assessment:  #1. Chronic diarrhea. No evidence of endoscopic or microscopic colitis on recent colonoscopy. No evidence of mucosal disease on duodenal biopsy as well. Recent chest CT suggests mass in head of pancreas; therefore she could have pancreatic insufficiency resulting in diarrhea.pancreas will need to be further evaluated with pancreas protocol CT or MR. 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 #2.GERD.  Patient is on pantoprazole.   Plan:  Discontinue Questran. Continue Lomotil or diphenoxylate 1 tablet by mouth twice a day. Creon 24,000, 2 capsules each meal and one with snack. Samples given along with prescription. Will discuss with Dr. Abran Duke as to further workup regarding pancreatic abnormalities. Will call with progress report in 2 weeks.

## 2013-11-21 NOTE — Anesthesia Postprocedure Evaluation (Signed)
Anesthesia Post Note  Patient: Katherine Ferguson  Procedure(s) Performed: Procedure(s) (LRB): UPPER ENDOSCOPIC ULTRASOUND (EUS) LINEAR (N/A)  Anesthesia type: MAC  Patient location: PACU  Post pain: Pain level controlled  Post assessment: Post-op Vital signs reviewed  Last Vitals: BP 141/74 mmHg  Pulse 80  Temp(Src) 36.9 C (Oral)  Resp 28  Wt 118 lb (53.524 kg)  SpO2 100%  Post vital signs: Reviewed  Level of consciousness: awake  Complications: No apparent anesthesia complications

## 2013-11-21 NOTE — Anesthesia Preprocedure Evaluation (Signed)
Anesthesia Evaluation  Patient identified by MRN, date of birth, ID band Patient awake    Reviewed: Allergy & Precautions, H&P , NPO status , Patient's Chart, lab work & pertinent test results, reviewed documented beta blocker date and time   Airway Mallampati: II  TM Distance: >3 FB Neck ROM: Full    Dental no notable dental hx.    Pulmonary neg pulmonary ROS,  breath sounds clear to auscultation  Pulmonary exam normal       Cardiovascular Pt. on home beta blockers + dysrhythmias Rhythm:Regular Rate:Normal     Neuro/Psych PSYCHIATRIC DISORDERS Anxiety negative neurological ROS     GI/Hepatic negative GI ROS, Neg liver ROS,   Endo/Other  Hypothyroidism Hyperthyroidism   Renal/GU negative Renal ROS     Musculoskeletal negative musculoskeletal ROS (+)   Abdominal   Peds  Hematology negative hematology ROS (+)   Anesthesia Other Findings   Reproductive/Obstetrics negative OB ROS                             Anesthesia Physical Anesthesia Plan  ASA: II  Anesthesia Plan: MAC   Post-op Pain Management:    Induction: Intravenous  Airway Management Planned:   Additional Equipment:   Intra-op Plan:   Post-operative Plan:   Informed Consent: I have reviewed the patients History and Physical, chart, labs and discussed the procedure including the risks, benefits and alternatives for the proposed anesthesia with the patient or authorized representative who has indicated his/her understanding and acceptance.   Dental advisory given  Plan Discussed with: CRNA  Anesthesia Plan Comments:         Anesthesia Quick Evaluation

## 2013-11-21 NOTE — Discharge Instructions (Signed)
Esophagogastroduodenoscopy Care After Refer to this sheet in the next few weeks. These instructions provide you with information on caring for yourself after your procedure. Your caregiver may also give you more specific instructions. Your treatment has been planned according to current medical practices, but problems sometimes occur. Call your caregiver if you have any problems or questions after your procedure.  HOME CARE INSTRUCTIONS  Do not eat or drink anything until the numbing medicine (local anesthetic) has worn off and your gag reflex has returned. You will know that the local anesthetic has worn off when you can swallow comfortably.  Do not drive for 12 hours after the procedure or as directed by your caregiver.  Only take medicines as directed by your caregiver. SEEK MEDICAL CARE IF:   You cannot stop coughing.  You are not urinating at all or less than usual. SEEK IMMEDIATE MEDICAL CARE IF:  You have difficulty swallowing.  You cannot eat or drink.  You have worsening throat or chest pain.  You have dizziness, lightheadedness, or you faint.  You have nausea or vomiting.  You have chills.  You have a fever.  You have severe abdominal pain.  You have black, tarry, or bloody stools. Document Released: 12/14/2011 Document Reviewed: 12/14/2011 [facility] Patient Information 2015 [place]. This information is not intended to replace advice given to you by your health care provider. Make sure you discuss any questions you have with your health care provider.   Esophagogastroduodenoscopy Care After Refer to this sheet in the next few weeks. These instructions provide you with information on caring for yourself after your procedure. Your caregiver may also give you more specific instructions. Your treatment has been planned according to current medical practices, but problems sometimes occur. Call your caregiver if you have any problems or questions after your  procedure.  HOME CARE INSTRUCTIONS  Do not eat or drink anything until the numbing medicine (local anesthetic) has worn off and your gag reflex has returned. You will know that the local anesthetic has worn off when you can swallow comfortably.  Do not drive for 12 hours after the procedure or as directed by your caregiver.  Only take medicines as directed by your caregiver. SEEK MEDICAL CARE IF:   You cannot stop coughing.  You are not urinating at all or less than usual. SEEK IMMEDIATE MEDICAL CARE IF:  You have difficulty swallowing.  You cannot eat or drink.  You have worsening throat or chest pain.  You have dizziness, lightheadedness, or you faint.  You have nausea or vomiting.  You have chills.  You have a fever.  You have severe abdominal pain.  You have black, tarry, or bloody stools. Document Released: 12/14/2011 Document Reviewed: 12/14/2011 [facility] Patient Information 2015 [place]. This information is not intended to replace advice given to you by your health care provider. Make sure you discuss any questions you have with your health care provider.    Conscious Sedation, Adult, Care After Refer to this sheet in the next few weeks. These instructions provide you with information on caring for yourself after your procedure. Your health care provider may also give you more specific instructions. Your treatment has been planned according to current medical practices, but problems sometimes occur. Call your health care provider if you have any problems or questions after your procedure. WHAT TO EXPECT AFTER THE PROCEDURE  After your procedure:  You may feel sleepy, clumsy, and have poor balance for several hours.  Vomiting may occur  if you eat too soon after the procedure. HOME CARE INSTRUCTIONS  Do not participate in any activities where you could become injured for at least 24 hours. Do not:  Drive.  Swim.  Ride a bicycle.  Operate heavy  machinery.  Cook.  Use power tools.  Climb ladders.  Work from a high place.  Do not make important decisions or sign legal documents until you are improved.  If you vomit, drink water, juice, or soup when you can drink without vomiting. Make sure you have little or no nausea before eating solid foods.  Only take over-the-counter or prescription medicines for pain, discomfort, or fever as directed by your health care provider.  Make sure you and your family fully understand everything about the medicines given to you, including what side effects may occur.  You should not drink alcohol, take sleeping pills, or take medicines that cause drowsiness for at least 24 hours.  If you smoke, do not smoke without supervision.  If you are feeling better, you may resume normal activities 24 hours after you were sedated.  Keep all appointments with your health care provider. SEEK MEDICAL CARE IF:  Your skin is pale or bluish in color.  You continue to feel nauseous or vomit.  Your pain is getting worse and is not helped by medicine.  You have bleeding or swelling.  You are still sleepy or feeling clumsy after 24 hours. SEEK IMMEDIATE MEDICAL CARE IF:  You develop a rash.  You have difficulty breathing.  You develop any type of allergic problem.  You have a fever. MAKE SURE YOU:  Understand these instructions.  Will watch your condition.  Will get help right away if you are not doing well or get worse. Document Released: 10/17/2012 Document Reviewed: 10/17/2012 [facility] Patient Information 2015 [place], [place]. This information is not intended to replace advice given to you by your health care provider. Make sure you discuss any questions you have with your health care provider.

## 2013-11-21 NOTE — Interval H&P Note (Signed)
History and Physical Interval Note:  11/21/2013 7:10 AM  Katherine Ferguson  has presented today for surgery, with the diagnosis of abnormal pancreas metastastic lung cancer  The various methods of treatment have been discussed with the patient and family. After consideration of risks, benefits and other options for treatment, the patient has consented to  Procedure(s): UPPER ENDOSCOPIC ULTRASOUND (EUS) LINEAR (N/A) as a surgical intervention .  The patient's history has been reviewed, patient examined, no change in status, stable for surgery.  I have reviewed the patient's chart and labs.  Questions were answered to the patient's satisfaction.     Milus Banister

## 2013-11-21 NOTE — Op Note (Signed)
[facility] [place], [address]   ENDOSCOPIC ULTRASOUND PROCEDURE REPORT PATIENT: Katherine Ferguson, Katherine Ferguson  MR#: [id number] BIRTHDATE: 03-04-1950  GENDER: female ENDOSCOPIST: Milus Banister, MD REFERRED BY:  Hildred Laser, M.D. 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 MEDICATIONS: Monitored anesthesia care DESCRIPTION OF PROCEDURE:   After the risks benefits and alternatives of the procedure were  explained, informed consent was obtained. The patient was then placed in the left, lateral, decubitus postion and IV sedation was administered. Throughout the procedure, the patients blood pressure, pulse and oxygen saturations were monitored continuously.  Under direct visualization, the PENTAX EUS SCOPE  endoscope was introduced through the mouth  and advanced to the second portion of the duodenum .  Water was used as necessary to provide an acoustic interface.  Upon completion of the imaging, water was removed and the patient was sent to the recovery room in satisfactory condition.   Endoscopic findings (limited views with radial and linear echoendoscopes): 1. Normal UGI tract EUS findings: 1. There was a seemingly encapsulated, heterogeneous, hypoechoic mass in the head of pancreas measuring 3.2cm across maximally. This does not obstruct the bile duct but does obstruct the main pancreatic duct (65mm in body, tail).  The mass directly abuts the SMV and compresses the vessel.  The mass was sampled with 3 transduodenal passes with a 25 Gauge EUS FNA needle, suction. 2. CBD was non-dilated 3. Main pancreatic duct was dilated in neck, body, tail of pancreas 4. No peripancreatic adenopathy. 5. Limited views of liver,  spleen were all normal  ENDOSCOPIC IMPRESSION: 3.2cm mass in head of pancreas (directly abuts the portal vein, obstructs the main pancreatic duct).  Preliminary cytology review is positive for malignancy (favoring adenocarcinoma).  Unclear if this is related to the metastatic process in chest, but I suspect it is.  Admittedly it is unusual for pancreatic adenocarcinoma to spread to the lungs without liver involvement.  RECOMMENDATIONS: I will forward these findings and final cytology to Drs. [name] and Njeistrom.   _______________________________ eSignedMilus Banister, MD 11/21/2013 8:57 AM

## 2013-11-21 NOTE — Transfer of Care (Signed)
Immediate Anesthesia Transfer of Care Note  Patient: Katherine Ferguson  Procedure(s) Performed: Procedure(s): UPPER ENDOSCOPIC ULTRASOUND (EUS) LINEAR (N/A)  Patient Location: PACU and Endoscopy Unit  Anesthesia Type:MAC  Level of Consciousness: awake, oriented, patient cooperative, lethargic and responds to stimulation  Airway & Oxygen Therapy: Patient Spontanous Breathing and Patient connected to nasal cannula oxygen  Post-op Assessment: Report given to PACU RN, Post -op Vital signs reviewed and stable and Patient moving all extremities  Post vital signs: Reviewed and stable  Complications: No apparent anesthesia complications

## 2013-11-22 ENCOUNTER — Encounter (HOSPITAL_COMMUNITY): Payer: Self-pay | Admitting: Gastroenterology

## 2013-12-24 ENCOUNTER — Encounter (INDEPENDENT_AMBULATORY_CARE_PROVIDER_SITE_OTHER): Payer: Self-pay | Admitting: Internal Medicine

## 2013-12-24 ENCOUNTER — Ambulatory Visit (INDEPENDENT_AMBULATORY_CARE_PROVIDER_SITE_OTHER): Payer: Medicare Other | Admitting: Internal Medicine

## 2013-12-24 VITALS — BP 102/70 | HR 66 | Temp 98.6°F | Resp 18 | Ht 62.0 in | Wt 118.8 lb

## 2013-12-24 DIAGNOSIS — K8681 Exocrine pancreatic insufficiency: Secondary | ICD-10-CM

## 2013-12-24 DIAGNOSIS — C259 Malignant neoplasm of pancreas, unspecified: Secondary | ICD-10-CM

## 2013-12-24 DIAGNOSIS — K868 Other specified diseases of pancreas: Secondary | ICD-10-CM

## 2013-12-24 MED ORDER — PANCRELIPASE (LIP-PROT-AMYL) 24000-76000 UNITS PO CPEP: ORAL_CAPSULE | ORAL | Status: DC

## 2013-12-24 NOTE — Patient Instructions (Signed)
Take 3 capsules of pancreatic enzyme with each meal as discussed and 1 with each snack.

## 2013-12-24 NOTE — Progress Notes (Signed)
Presenting complaint;  Follow-up for exocrine pancreatic insufficiency.  Database ;   Katherine Ferguson 63 year old African-American female who was evaluated one year ago for diarrhea and weight loss. She was diagnosed with metastatic carcinoma thought to be lung primary but CT suggested pancreatic tumor. She was felt to have EPI and begun on Creon-24 for exocrine pancreatic insufficiency. She subsequently had pancreatic EUS with FNA confirming diagnosis of pancreatic adenocarcinoma. Patient also has history of thyrotoxicosis on medical therapy. Patient was advised to keep stool diary until this office visit.  Subjective;  Patient has Stool diary as recommended. Stool diary was reviewed with the patient. She has been having anywhere from 1-3 stools per day. Only on one occasion she had solid stool. On others she has liquids to which she stools. Overall she feels better. She remains with good appetite. She has abdominal pain on some days controlled with pain medication. She denies nocturnal diarrhea. Patient states her chemotherapy has been changed. She received gemcitabine and Navelbine infusion 2 weeks ago and she is due for treatment today. She has lost 2 pounds since her last visit.      Current Medications: Outpatient Encounter Prescriptions as of 12/24/2013  Medication Sig  . benzonatate (TESSALON) 100 MG capsule Take 100 mg by mouth 3 (three) times daily as needed.   . calcium carbonate (OS-CAL) 600 MG TABS tablet Take 600 mg by mouth daily at 12 noon.   . Cholecalciferol (VITAMIN D) 2000 UNITS tablet Take 2,000 Units by mouth daily with lunch.   . Cyanocobalamin (VITAMIN B-12 IJ) Inject 1 inch as directed every 6 (six) months.   . diazepam (VALIUM) 5 MG tablet Take 5 mg by mouth every 6 (six) hours as needed for anxiety.  . diphenoxylate-atropine (LOMOTIL) 2.5-0.025 MG per tablet Take 1 tablet by mouth 2 (two) times daily.   . folic acid (FOLVITE) 1 MG tablet Take 1 mg by mouth every  morning.   . lidocaine-prilocaine (EMLA) cream Apply 1 application topically every 14 (fourteen) days.   Marland Kitchen LORazepam (ATIVAN) 0.5 MG tablet Take 0.5 mg by mouth every 8 (eight) hours as needed for anxiety.  . methimazole (TAPAZOLE) 10 MG tablet Take 5 mg by mouth every morning.   . metoprolol (LOPRESSOR) 50 MG tablet Take 50 mg by mouth every morning.   . Multiple Vitamin (THERA) TABS Take 1 tablet by mouth daily at 12 noon.   Marland Kitchen oxycodone (OXY-IR) 5 MG capsule Take 5-10 mg by mouth every 4 (four) hours as needed for pain.  . Pancrelipase, Lip-Prot-Amyl, 24000 UNITS CPEP 2 capsules by mouth each meal and one with snack. (Patient taking differently: Take 1-2 capsules by mouth. 2 capsules by mouth each meal and one with snack.)  . pantoprazole (PROTONIX) 40 MG tablet Take 1 tablet (40 mg total) by mouth daily before breakfast.  . potassium chloride (K-DUR) 10 MEQ tablet Take 10 mEq by mouth 2 (two) times daily.   Marland Kitchen dexamethasone (DECADRON) 4 MG tablet Take 4 mg by mouth 2 (two) times daily. Take 2 days before, the day of, and 2 days after chemo.     Objective: Blood pressure 102/70, pulse 66, temperature 98.6 F (37 C), temperature source Oral, resp. rate 18, height 5\' 2"  (1.575 m), weight 118 lb 12.8 oz (53.887 kg). Patient is alert and in no acute distress. Conjunctiva is pink. Sclera is nonicteric Oropharyngeal mucosa is normal. No neck masses or thyromegaly noted. Cardiac exam with regular rhythm normal S1 and S2. No murmur or gallop  noted. Lungs are clear to auscultation. Abdomen is symmetrical. Bowel sounds are normal. Abdomen is soft with mild midepigastric tenderness. Soft approximately 4 cm mass in right low quadrant above the inguinal ligament is unchanged. No LE edema or clubbing noted.   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 Diarrhea has improved but not resolved with pancreatic enzyme supplement.  #2. Metastatic pancreatic  adenocarcinoma. Patient is under care of Dr. Everardo All and now on gemcitabine and Navellbine. #3. GERD. EGD 6 weeks ago revealed erosive reflux esophagitis as well as hiatal hernia. She is doing well with therapy. #4.   Plan:  Increase Creon 24K dose to 3 capsules with each meal and 1 capsule with each snack. Office visit in 3 months.

## 2014-03-18 ENCOUNTER — Ambulatory Visit (INDEPENDENT_AMBULATORY_CARE_PROVIDER_SITE_OTHER): Payer: Medicare Other | Admitting: Internal Medicine

## 2014-03-18 ENCOUNTER — Encounter (INDEPENDENT_AMBULATORY_CARE_PROVIDER_SITE_OTHER): Payer: Self-pay | Admitting: Internal Medicine

## 2014-03-18 VITALS — BP 102/66 | HR 68 | Temp 97.3°F | Resp 18 | Ht 62.0 in | Wt 119.7 lb

## 2014-03-18 DIAGNOSIS — C259 Malignant neoplasm of pancreas, unspecified: Secondary | ICD-10-CM

## 2014-03-18 DIAGNOSIS — K529 Noninfective gastroenteritis and colitis, unspecified: Secondary | ICD-10-CM

## 2014-03-18 DIAGNOSIS — K868 Other specified diseases of pancreas: Secondary | ICD-10-CM

## 2014-03-18 DIAGNOSIS — K8681 Exocrine pancreatic insufficiency: Secondary | ICD-10-CM

## 2014-03-18 MED ORDER — DIPHENOXYLATE-ATROPINE 2.5-0.025 MG PO TABS: 1.0000 | ORAL_TABLET | Freq: Three times a day (TID) | ORAL | Status: AC

## 2014-03-18 NOTE — Progress Notes (Signed)
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 She was last seen about 3 months ago. She says she is responding to gemcitabine and Navelbine as evidenced by decrease in his size of lung nodules on a CT done about 2 weeks ago by Dr. Tressie Stalker. She continues to have diarrhea. She has anywhere from 3 to 8 stools per day. Stool consistency varies from loose to semi-formed to formed stool. She seemed to have more diarrhea soon after receiving chemotherapy and she receives infusion every 21 days. She also has more diarrhea and bloating with certain foods. She says more than 50% of her stools are loose to semi-formed. She remains with intermittent upper abdominal pain which is not daily and never intense or severe. She has not lost any weight since her last visit. She developed a rash in inguinal areas which he thought was due to pancreatic enzyme supplement and she decided to take 2 capsules with each meal rather than 3 and rash resolved. However she also has been using topical as well. She denies melena or rectal bleeding nausea or vomiting and her appetite is fair.   Current Medications: Outpatient Encounter Prescriptions as of 03/18/2014  Medication Sig  . benzonatate (TESSALON) 100 MG capsule Take 100 mg by mouth 3 (three) times daily as needed.   . calcium carbonate (OS-CAL) 600 MG TABS tablet Take 600 mg by mouth daily at 12 noon.   . Cholecalciferol (VITAMIN D) 2000 UNITS tablet Take 2,000 Units by mouth daily with lunch.   . dexamethasone (DECADRON) 4 MG tablet Take 4 mg by mouth 2 (two) times daily. Take 2 days before, the day of, and 2 days after chemo.  . diphenoxylate-atropine (LOMOTIL)  2.5-0.025 MG per tablet Take 1 tablet by mouth 2 (two) times daily.   . folic acid (FOLVITE) 1 MG tablet Take 1 mg by mouth daily.  Marland Kitchen LORazepam (ATIVAN) 0.5 MG tablet Take 0.5 mg by mouth every 8 (eight) hours as needed for anxiety.  . megestrol (MEGACE) 40 MG/ML suspension Patient states that she takes on as needed basis  . methimazole (TAPAZOLE) 10 MG tablet Take 5 mg by mouth every morning.   . metoprolol (LOPRESSOR) 50 MG tablet Take 50 mg by mouth every morning.   . Multiple Vitamin (THERA) TABS Take 1 tablet by mouth daily at 12 noon.   Marland Kitchen oxycodone (OXY-IR) 5 MG capsule Take 5-10 mg by mouth every 4 (four) hours as needed for pain.  . Pancrelipase, Lip-Prot-Amyl, 24000 UNITS CPEP 3capsules by mouth each meal and one with snack.  . pantoprazole (PROTONIX) 40 MG tablet Take 1 tablet (40 mg total) by mouth daily before breakfast.  . potassium chloride (K-DUR) 10 MEQ tablet Take 10 mEq by mouth 2 (two) times daily.   . [DISCONTINUED] Cyanocobalamin (VITAMIN B-12 IJ) Inject 1 inch as directed every 6 (six) months.   . [DISCONTINUED] loperamide (IMODIUM) 2 MG capsule Take 2 mg by mouth 2 (two) times daily.  . [DISCONTINUED] oxyCODONE (OXY IR/ROXICODONE) 5 MG immediate release tablet Take 5 mg by mouth every 4 (four) hours as needed. for pain     Objective: Blood pressure 102/66, pulse 68, temperature 97.3 F (36.3 C), temperature source Oral, resp. rate 18, height 5\' 2"  (1.575 m), weight  119 lb 11.2 oz (54.296 kg). Patient is alert and in no acute distress. Conjunctiva is pink. Sclera is nonicteric Oropharyngeal mucosa is normal. No neck masses or thyromegaly noted. Cardiac exam with regular rhythm normal S1 and S2. No murmur or gallop noted. Lungs are clear to auscultation. Abdomen symmetrical and soft. She has mild tenderness in epigastric region but no organomegaly or masses noted. No LE edema or clubbing noted.  Labs/studies Results:  Chest and abdominopelvic CT reviewed from  02/28/2014. Multiple lung nodules which have decreased in size compared to previous study. Slight increase in hepatic metastatic lesions and pancreatic lesions unchanged. Dilated pancreatic duct in body.  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 Diarrhea is not well-controlled with pancreatic enzyme supplement. She may benefit from higher dose if she is able to tolerate it. #2. Pancreatic adenocarcinoma metastatic to liver and lungs. Patient is receiving chemotherapy under supervision of Dr. Tressie Stalker and appears to be responding.   Plan:  Try four capsules of pancreatic enzyme supplement with meals and one with snack. Can use Gas-X or Phazyme OTC as-needed basis for flatulence. Diphenoxylate 1 tablet by mouth 3 times a day. Prescription given. Progress report in 1 month. Office visit in 3 months.

## 2014-03-18 NOTE — Patient Instructions (Addendum)
Try taking 4 capsules of pancreatic enzyme with some meals and see if you have less diarrhea. Can try Phazyme or Gas-X for flatulence 1 tablet up to 3 times a day as needed Call with progress report in one month.

## 2014-03-31 ENCOUNTER — Ambulatory Visit (INDEPENDENT_AMBULATORY_CARE_PROVIDER_SITE_OTHER): Payer: Medicare Other | Admitting: Internal Medicine

## 2014-04-28 ENCOUNTER — Other Ambulatory Visit (INDEPENDENT_AMBULATORY_CARE_PROVIDER_SITE_OTHER): Payer: Self-pay | Admitting: Internal Medicine

## 2014-06-06 IMAGING — DX DG OUTSIDE FILMS CHEST
1 series · 2 of 2 positions shown · non-contrast
Comparison: none

[Series 1: left lateral · left · 0.14mm/px · 2 of 2 slices shown]
[im 1/2]
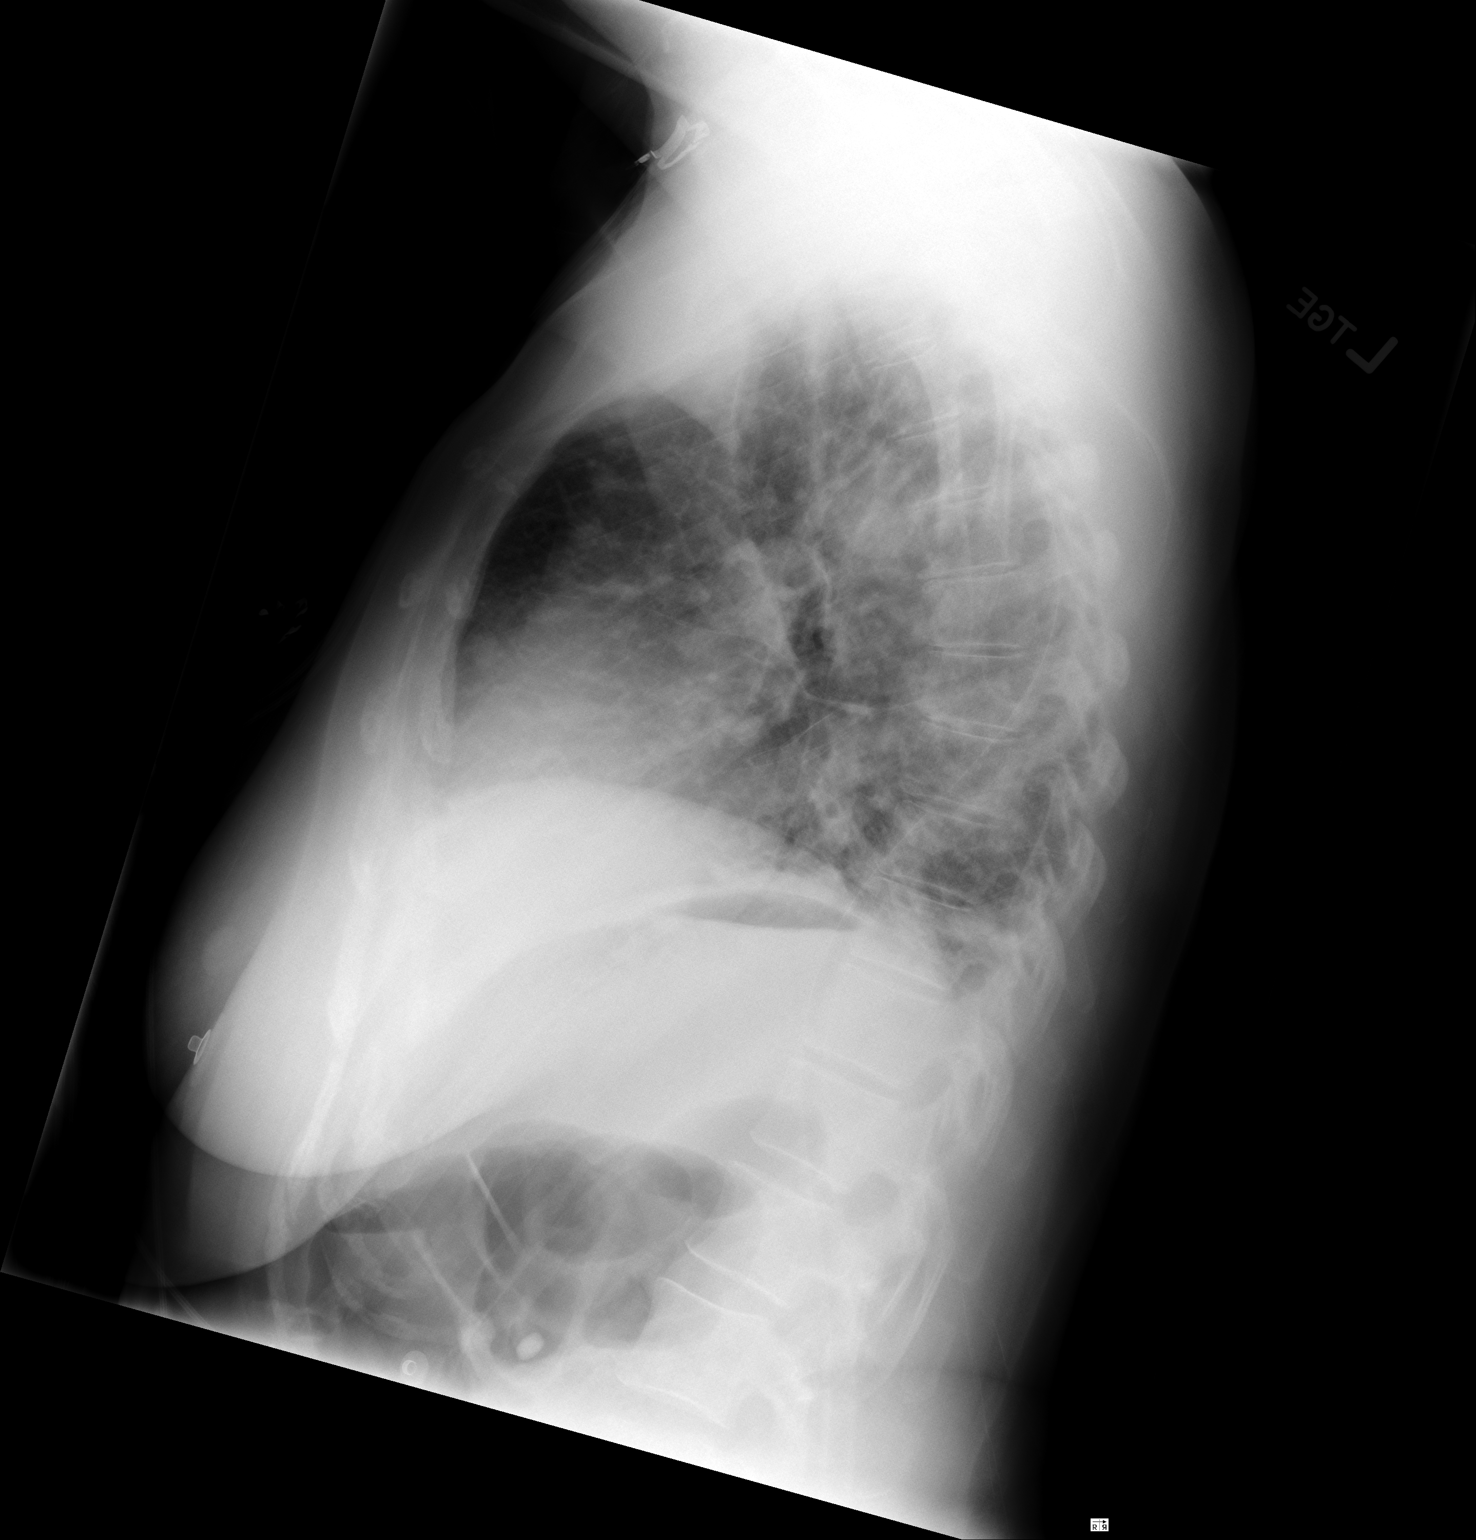
[im 2/2]
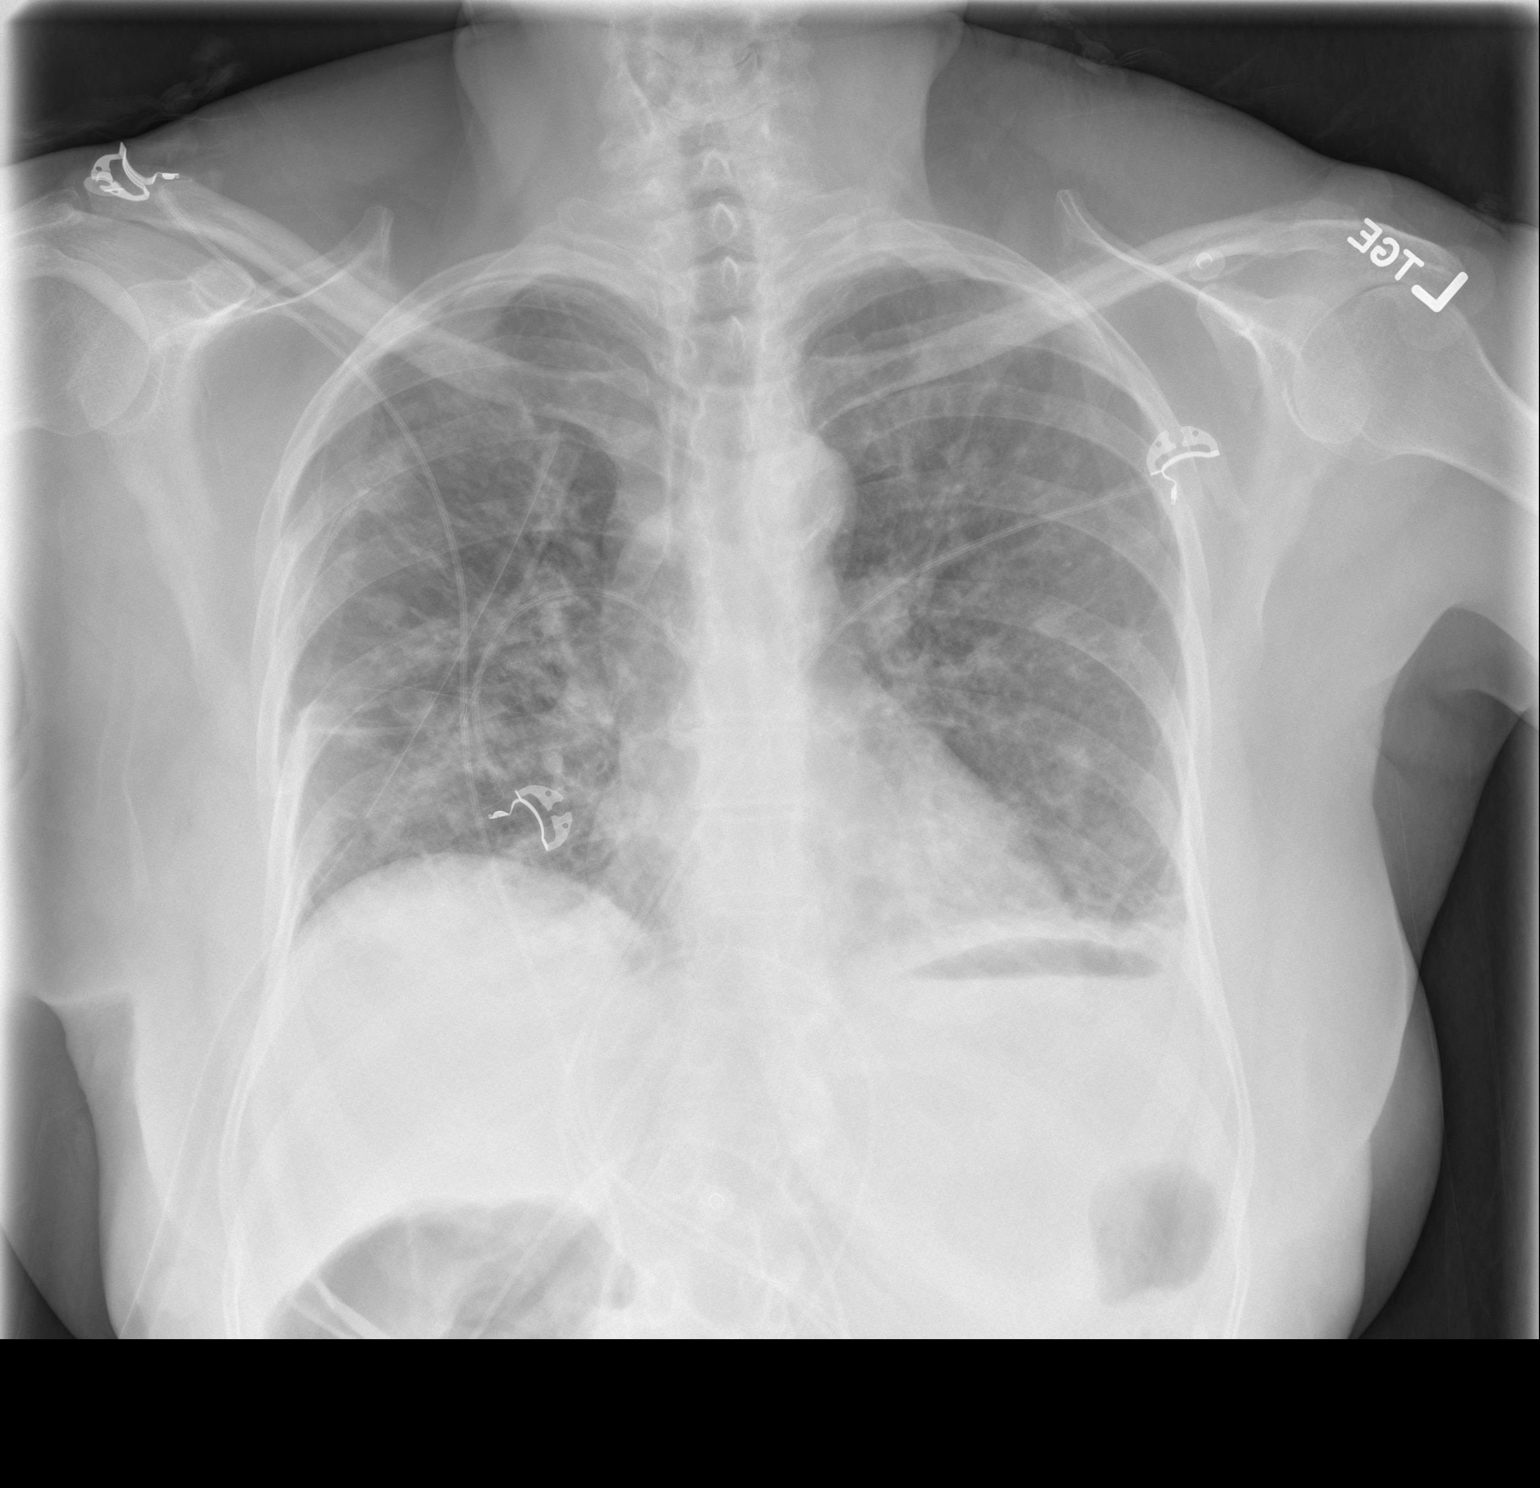

[2 of 2 positions shown; findings below may reference images not displayed]

Canned report from images found in remote index.

Refer to host system for actual result text.

## 2014-06-06 IMAGING — DX DG OUTSIDE FILMS CHEST
1 series · 2 of 2 positions shown · non-contrast
Comparison: none

[Series 1: PA · left · 0.14mm/px · 2 of 2 slices shown]
[im 1/2]
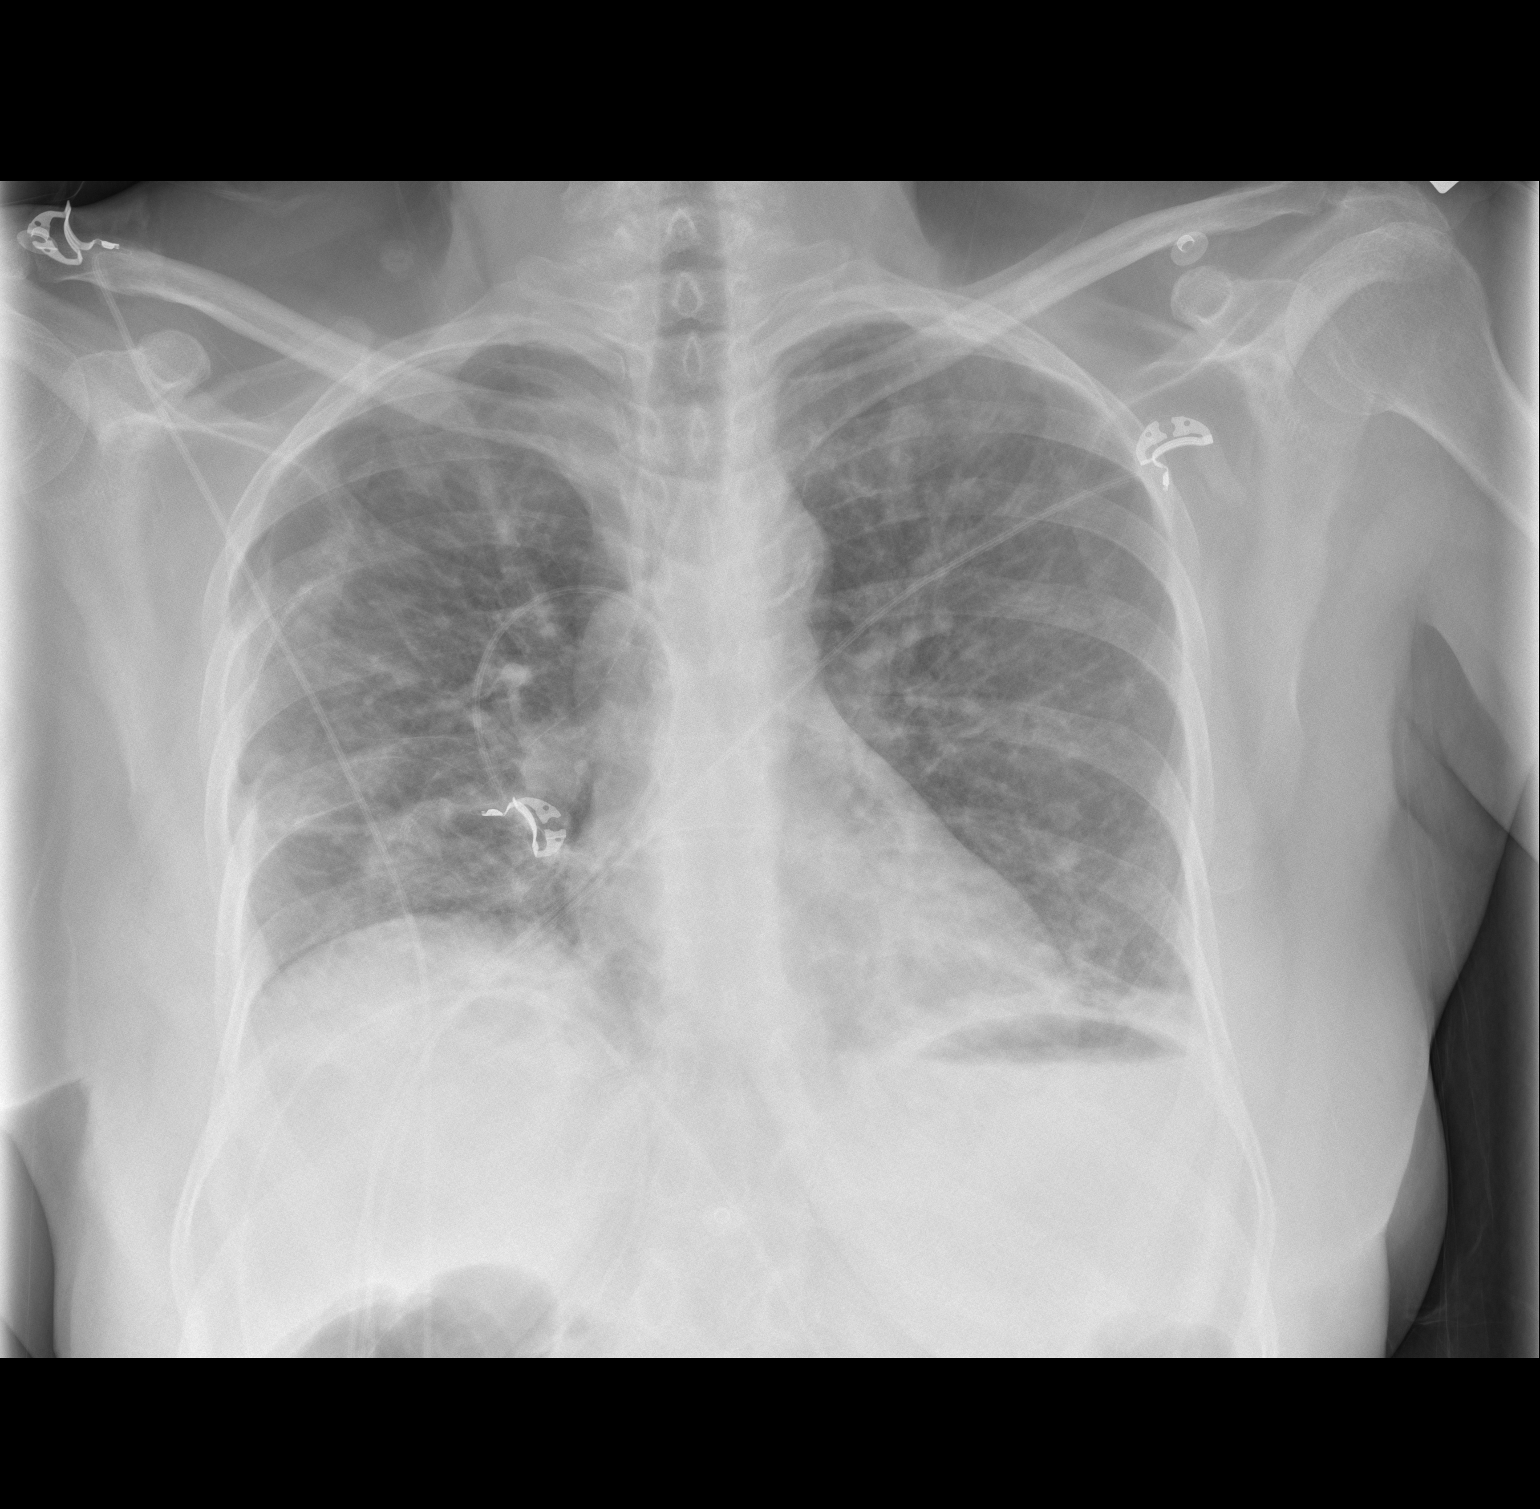
[im 2/2]
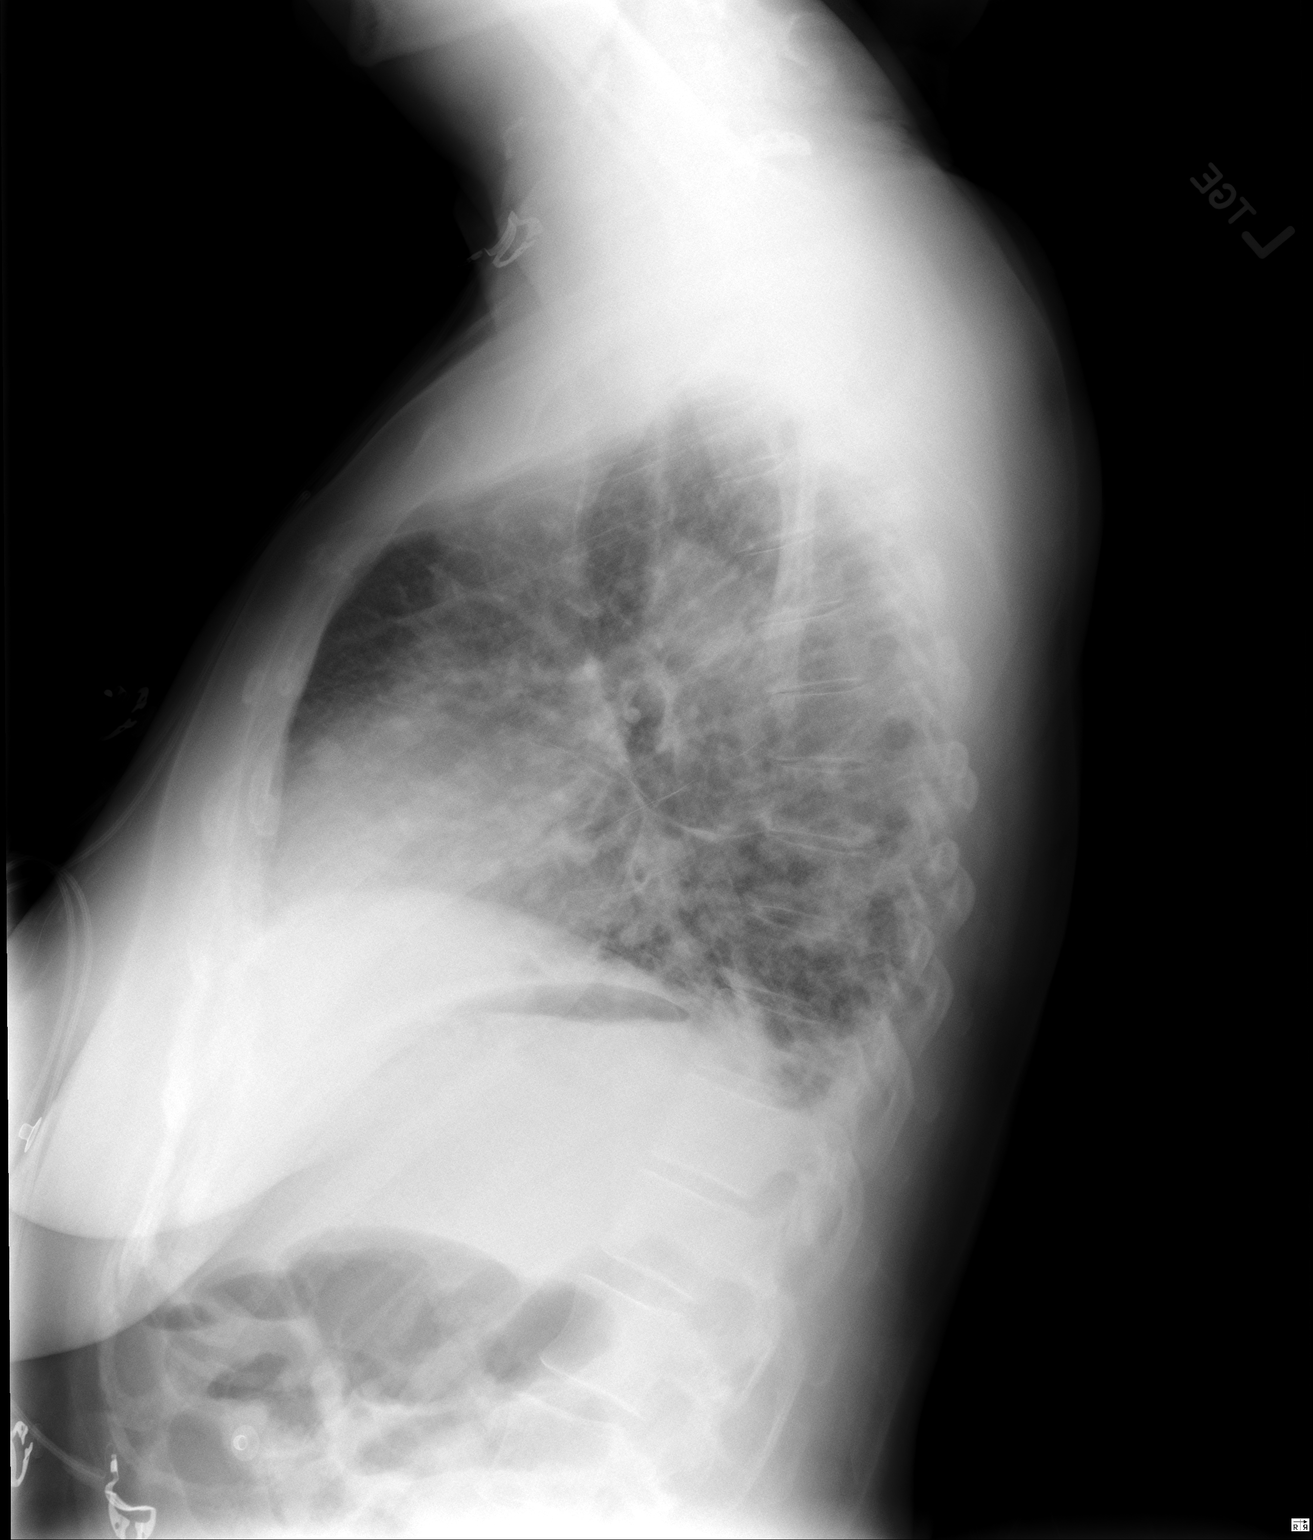

[2 of 2 positions shown; findings below may reference images not displayed]

Canned report from images found in remote index.

Refer to host system for actual result text.

## 2014-06-30 ENCOUNTER — Telehealth (INDEPENDENT_AMBULATORY_CARE_PROVIDER_SITE_OTHER): Payer: Self-pay | Admitting: *Deleted

## 2014-06-30 NOTE — Telephone Encounter (Signed)
Katherine Ferguson cancelled her apt for 07/01/14. She will be having Chemo and has it for 3 days. Would like for Dr. Laural Golden to know the Chemo she is on makes her diarrhea worse. She is stilling taking the Creon but would like to know if there is anything different she could do. The apt was rescheduled to 07/07/14 at 2:30 pm with Dr. Laural Golden. May also said she went to [place] for a few day and ended up having fluids everyday. The return phone number is [phone].

## 2014-06-30 NOTE — Telephone Encounter (Signed)
Per Dr.[name] - have the patient to call us 07-01-14 and let us know the volume and the consistency of stool. Patient was called and made aware. She will be going to have K+ tomorrow prior to Chemo treatment,she will call us soon as possible.

## 2014-06-30 NOTE — Telephone Encounter (Signed)
Per Dr.[name] - Increase Creon to to 4 with each meal. Patient was called and made aware. Stool Volume for 2-3 days - patient states that it is pure water today,(06-30-14.) Dr.[name] will be made aware any further recommendations -patient will be called back.

## 2014-07-01 ENCOUNTER — Ambulatory Visit (INDEPENDENT_AMBULATORY_CARE_PROVIDER_SITE_OTHER): Payer: Medicare Other | Admitting: Internal Medicine

## 2014-07-07 ENCOUNTER — Encounter (INDEPENDENT_AMBULATORY_CARE_PROVIDER_SITE_OTHER): Payer: Self-pay | Admitting: Internal Medicine

## 2014-07-07 ENCOUNTER — Ambulatory Visit (INDEPENDENT_AMBULATORY_CARE_PROVIDER_SITE_OTHER): Payer: Medicare Other | Admitting: Internal Medicine

## 2014-07-07 VITALS — BP 110/68 | HR 76 | Temp 98.1°F | Resp 18 | Ht 62.0 in | Wt 102.8 lb

## 2014-07-07 DIAGNOSIS — C259 Malignant neoplasm of pancreas, unspecified: Secondary | ICD-10-CM

## 2014-07-07 DIAGNOSIS — R634 Abnormal weight loss: Secondary | ICD-10-CM

## 2014-07-07 DIAGNOSIS — K868 Other specified diseases of pancreas: Secondary | ICD-10-CM | POA: Diagnosis not present

## 2014-07-07 DIAGNOSIS — K8681 Exocrine pancreatic insufficiency: Secondary | ICD-10-CM

## 2014-07-07 MED ORDER — PANCRELIPASE (LIP-PROT-AMYL) 24000-76000 UNITS PO CPEP: ORAL_CAPSULE | ORAL | Status: AC

## 2014-07-07 NOTE — Patient Instructions (Signed)
Total Creon 24K dose per day is 14 capsules. Will call you later this week with further recommendations.

## 2014-07-07 NOTE — Progress Notes (Signed)
Presenting complaint;  Diarrhea and weight loss.  Subjective:  Patient is 64 year old African-American female who is here for scheduled visit accompanied by her husband Gwyndolyn Saxon. He was last seen on 03/18/2014 and weight approximately 120 pounds. He has lost 18 pounds in the last 14 weeks. She states diarrhea has gotten worse since she has been on new chemotherapy which is Cyramza and Docetaxol. She is receiving infusion every 3 weeks. He is having 10-12 stools per day. Most of her stools are loose watery and at times she passes pieces of formed stool or what she describes to be sand.. She is taking as many as 8 Lomotil per day. She is able to sleep most nights but every now and then she has nocturnal bowel movement. She states stool or has decreased. She denies melena or rectal bleeding. She has intermittent midabdominal pain. She is trying to drink a lot of fluids. She denies nausea or vomiting. She has noted soreness in her mouth since she has been on new chemotherapy. She is due for next dose in 8 days.    Current Medications: Outpatient Encounter Prescriptions as of 07/07/2014  Medication Sig  . benzonatate (TESSALON) 100 MG capsule Take 100 mg by mouth 3 (three) times daily as needed.   . calcium carbonate (OS-CAL) 600 MG TABS tablet Take 600 mg by mouth 3 (three) times daily with meals.   . Cholecalciferol (VITAMIN D) 2000 UNITS tablet Take 2,000 Units by mouth daily with lunch.   . diphenoxylate-atropine (LOMOTIL) 2.5-0.025 MG per tablet Take 1 tablet by mouth 3 (three) times daily before meals.  . folic acid (FOLVITE) 1 MG tablet Take 1 mg by mouth daily.  Marland Kitchen KLOR-CON M10 10 MEQ tablet Take 10 mEq by mouth 3 (three) times daily.  Marland Kitchen loperamide (IMODIUM) 2 MG capsule TAKE ONE CAPSULE BY MOUTH TWICE A DAY  . LORazepam (ATIVAN) 0.5 MG tablet Take 0.5 mg by mouth every 8 (eight) hours as needed for anxiety.  . megestrol (MEGACE) 40 MG/ML suspension 3 (three) times daily. Patient states that  she takes on as needed basis  . methimazole (TAPAZOLE) 10 MG tablet Take 5 mg by mouth every morning.   . metoprolol (LOPRESSOR) 50 MG tablet Take 50 mg by mouth every morning.   . Multiple Vitamin (THERA) TABS Take 1 tablet by mouth daily at 12 noon.   Marland Kitchen oxycodone (OXY-IR) 5 MG capsule Take 5-10 mg by mouth every 4 (four) hours as needed for pain.  . Pancrelipase, Lip-Prot-Amyl, 24000 UNITS CPEP 3capsules by mouth each meal and one with snack.  . pantoprazole (PROTONIX) 40 MG tablet Take 1 tablet (40 mg total) by mouth daily before breakfast.  . dexamethasone (DECADRON) 4 MG tablet Take 4 mg by mouth 2 (two) times daily. Take 2 days before, the day of, and 2 days after chemo.  . [DISCONTINUED] potassium chloride (K-DUR) 10 MEQ tablet Take 10 mEq by mouth 2 (two) times daily.    No facility-administered encounter medications on file as of 07/07/2014.     Objective: Blood pressure 110/68, pulse 76, temperature 98.1 F (36.7 C), temperature source Oral, resp. rate 18, height '5\' 2"'$  (1.575 m), weight 102 lb 12.8 oz (46.63 kg). Patient is alert and in no acute distress. Conjunctiva is pink. Sclera is nonicteric Oropharyngeal mucosa is normal. No neck masses or thyromegaly noted. Cardiac exam with regular rhythm normal S1 and S2. No murmur or gallop noted. Lungs are clear to auscultation. Abdomen is symmetrical. Bowel sounds are hyperactive.  On palpation abdomen is soft without hepatosplenomegaly. Soft subcutaneous lump noted inside right anterior iliac spine unchanged from previous exam.  No LE edema or clubbing noted.    Assessment:  #1. Chronic diarrhea secondary to exocrine pancreatic insufficiency. Diarrhea has relapsed despite therapy with pancreatic enzyme supplement and dose appears to be adequate. I believe diarrhea has worsened because of toxic effects of chemotherapy on small bowel mucosa the also need to make sure she is euthyroid. #2. Metastatic pancreatic adenocarcinoma. Patient  is on Docetaxol and Cyramza both. She will be restaged after next dose of chemotherapy. #3. Weight loss. She has lost 18 pounds in the last 14 weeks and most of her weight loss appears to be secondary to diarrhea.  Plan:  Continue Creon 24K for capsules with each meal and one with snack. If she eats multiple small snacks she can tailor pancreatic enzyme supplement to one capsule with small snack and 2 with large snack. Patient should take 14 capsules per day. This dose appears to be more than adequate based on her body weight. New prescription given for Creon 24K. She will continue Lomotil no more than 8 tablets per day as recommended by Dr. Tressie Stalker. She may benefit from octreotide injection which I will discuss with Dr. Abran Duke. Patient advised to get TSH checked with next blood draw. Office visit in 3 months.

## 2014-07-08 ENCOUNTER — Telehealth (INDEPENDENT_AMBULATORY_CARE_PROVIDER_SITE_OTHER): Payer: Self-pay | Admitting: Internal Medicine

## 2014-07-08 ENCOUNTER — Other Ambulatory Visit (INDEPENDENT_AMBULATORY_CARE_PROVIDER_SITE_OTHER): Payer: Self-pay | Admitting: Internal Medicine

## 2014-07-08 MED ORDER — OCTREOTIDE ACETATE 200 MCG/ML IJ SOLN: 50.0000 ug | Freq: Three times a day (TID) | INTRAMUSCULAR | Status: AC

## 2014-07-08 NOTE — Telephone Encounter (Signed)
Discussed with Dr. Everardo All. Will start patient on octreotide 50 g subcutaneous every 8 hours for 2 days and then will change dose. Prescription  sent to patient's pharmacy. Patient's husband was informed

## 2014-07-11 ENCOUNTER — Telehealth (INDEPENDENT_AMBULATORY_CARE_PROVIDER_SITE_OTHER): Payer: Self-pay | Admitting: *Deleted

## 2014-07-11 NOTE — Telephone Encounter (Signed)
Patient has not started octreotide yet. Waiting for mail order to be filled.

## 2014-07-11 NOTE — Telephone Encounter (Signed)
Patient called and states that she has not been able to get the injection, so she can't give Dr.[name] a progress report. I called [facility] and he states that this had to go to a specialty pharmacy and that they were to contact the patient when it was processed, and that the patient has been made aware. He provided the following number for me [phone],(Specialty Pharmacy). I called and was told that they had the prescription and that the current status was ,it is with insurance for approval. Once this is done they will call the patient and make arrangements for delivery.  Patient was called and made aware , she will call once she has begun the medication. Her call back number is [phone].

## 2014-07-24 ENCOUNTER — Telehealth (INDEPENDENT_AMBULATORY_CARE_PROVIDER_SITE_OTHER): Payer: Self-pay | Admitting: *Deleted

## 2014-07-24 NOTE — Telephone Encounter (Signed)
Octreotide seems to be working

## 2014-07-24 NOTE — Telephone Encounter (Addendum)
Spoke with Dr. Laural Golden and he said Katherine Ferguson should inject 100 mcg every 12 hours. Advised patient and voices understood. This is the correct amount she started on 07/23/14 pm. She is doing okay, energy is not the best and diarrhea is less. Had diarrhea twice yesterday and today also, which is less than it has been.

## 2014-07-24 NOTE — Telephone Encounter (Signed)
Katherine Ferguson called to clarify the injection amount and how often to inject. Olene is taking 100 mcg twice daily which was different from what I could see. Will page Dr. Laural Golden to get the correct information and call [facility] back.

## 2014-08-05 ENCOUNTER — Other Ambulatory Visit (INDEPENDENT_AMBULATORY_CARE_PROVIDER_SITE_OTHER): Payer: Self-pay | Admitting: *Deleted

## 2014-08-05 ENCOUNTER — Telehealth (INDEPENDENT_AMBULATORY_CARE_PROVIDER_SITE_OTHER): Payer: Self-pay | Admitting: *Deleted

## 2014-08-05 DIAGNOSIS — K831 Obstruction of bile duct: Secondary | ICD-10-CM

## 2014-08-05 NOTE — Telephone Encounter (Signed)
Per Dr.[name] the patient is having an U/S.

## 2014-08-05 NOTE — Telephone Encounter (Signed)
Jung said the shots are working. She has slacked up with the diarrhea, doing better. The daughter is giving them to her every 12 hours as directed. Her eyes are a little yellow. The return phone number is [phone].

## 2014-08-05 NOTE — Telephone Encounter (Signed)
Dr.Neijstrom called our office and has ask that Dr.[name] call him. He has done labs and needs to talk ASAP to [place].

## 2014-08-06 ENCOUNTER — Encounter (HOSPITAL_COMMUNITY)
Admission: RE | Admit: 2014-08-06 | Discharge: 2014-08-06 | Disposition: A | Discharge status: Home / Self Care | Payer: Medicare Other | Source: Ambulatory Visit | Attending: Internal Medicine | Admitting: Internal Medicine

## 2014-08-06 ENCOUNTER — Encounter (HOSPITAL_COMMUNITY): Payer: Self-pay

## 2014-08-06 ENCOUNTER — Other Ambulatory Visit: Payer: Self-pay

## 2014-08-06 VITALS — BP 123/61 | Temp 98.2°F | Resp 18 | Ht 62.0 in | Wt 103.0 lb

## 2014-08-06 DIAGNOSIS — F419 Anxiety disorder, unspecified: Secondary | ICD-10-CM | POA: Diagnosis not present

## 2014-08-06 DIAGNOSIS — C787 Secondary malignant neoplasm of liver and intrahepatic bile duct: Secondary | ICD-10-CM | POA: Diagnosis present

## 2014-08-06 DIAGNOSIS — Z01812 Encounter for preprocedural laboratory examination: Secondary | ICD-10-CM | POA: Diagnosis not present

## 2014-08-06 DIAGNOSIS — R17 Unspecified jaundice: Secondary | ICD-10-CM | POA: Diagnosis not present

## 2014-08-06 DIAGNOSIS — K831 Obstruction of bile duct: Secondary | ICD-10-CM | POA: Diagnosis not present

## 2014-08-06 DIAGNOSIS — Z01810 Encounter for preprocedural cardiovascular examination: Secondary | ICD-10-CM | POA: Diagnosis not present

## 2014-08-06 DIAGNOSIS — C7800 Secondary malignant neoplasm of unspecified lung: Secondary | ICD-10-CM | POA: Diagnosis not present

## 2014-08-06 DIAGNOSIS — E039 Hypothyroidism, unspecified: Secondary | ICD-10-CM | POA: Diagnosis not present

## 2014-08-06 DIAGNOSIS — C259 Malignant neoplasm of pancreas, unspecified: Secondary | ICD-10-CM | POA: Diagnosis not present

## 2014-08-06 HISTORY — DX: Unspecified osteoarthritis, unspecified site: M19.90

## 2014-08-06 HISTORY — DX: Gastro-esophageal reflux disease without esophagitis: K21.9

## 2014-08-06 HISTORY — DX: Noninfective gastroenteritis and colitis, unspecified: K52.9

## 2014-08-06 LAB — CBC WITH DIFFERENTIAL/PLATELET
BASOS ABS: 0 10*3/uL (ref 0.0–0.1)
Basophils Relative: 0 % (ref 0–1)
EOS ABS: 0.2 10*3/uL (ref 0.0–0.7)
EOS PCT: 1 % (ref 0–5)
HCT: 27.8 % — ABNORMAL LOW (ref 36.0–46.0)
HEMOGLOBIN: 9.1 g/dL — AB (ref 12.0–15.0)
LYMPHS ABS: 1.8 10*3/uL (ref 0.7–4.0)
Lymphocytes Relative: 9 % — ABNORMAL LOW (ref 12–46)
MCH: 29.8 pg (ref 26.0–34.0)
MCHC: 32.7 g/dL (ref 30.0–36.0)
MCV: 91.1 fL (ref 78.0–100.0)
MONOS PCT: 3 % (ref 3–12)
Monocytes Absolute: 0.6 10*3/uL (ref 0.1–1.0)
Neutro Abs: 17.2 10*3/uL — ABNORMAL HIGH (ref 1.7–7.7)
Neutrophils Relative %: 87 % — ABNORMAL HIGH (ref 43–77)
Platelets: 236 10*3/uL (ref 150–400)
RBC: 3.05 MIL/uL — AB (ref 3.87–5.11)
RDW: 18.6 % — ABNORMAL HIGH (ref 11.5–15.5)
WBC Morphology: INCREASED
WBC: 19.8 10*3/uL — AB (ref 4.0–10.5)

## 2014-08-06 LAB — COMPREHENSIVE METABOLIC PANEL
ALBUMIN: 3.2 g/dL — AB (ref 3.5–5.0)
ALK PHOS: 722 U/L — AB (ref 38–126)
ALT: 190 U/L — ABNORMAL HIGH (ref 14–54)
AST: 165 U/L — AB (ref 15–41)
Anion gap: 9 (ref 5–15)
BUN: 7 mg/dL (ref 6–20)
CO2: 24 mmol/L (ref 22–32)
CREATININE: 0.61 mg/dL (ref 0.44–1.00)
Calcium: 7.4 mg/dL — ABNORMAL LOW (ref 8.9–10.3)
Chloride: 105 mmol/L (ref 101–111)
GFR calc Af Amer: >60 mL/min (ref >60)
GLUCOSE: 95 mg/dL (ref 65–99)
POTASSIUM: 3.6 mmol/L (ref 3.5–5.1)
SODIUM: 138 mmol/L (ref 135–145)
Total Bilirubin: 6.1 mg/dL — ABNORMAL HIGH (ref 0.3–1.2)
Total Protein: 6.1 g/dL — ABNORMAL LOW (ref 6.5–8.1)

## 2014-08-06 NOTE — Patient Instructions (Signed)
Katherine Ferguson  08/06/2014     '@PREFPERIOPPHARMACY'$ @   Your procedure is scheduled on  08/08/2014  Report to [facility] at  615  A.M.  Call this number if you have problems the morning of surgery:  [phone]   Remember:  Do not eat food or drink liquids after midnight.  Take these medicines the morning of surgery with A SIP OF WATER : tessalon, decadron, ativan, protonix, metoprolol, tapazole.   Do not wear jewelry, make-up or nail polish.  Do not wear lotions, powders, or perfumes.    Do not shave 48 hours prior to surgery.  Men may shave face and neck.  Do not bring valuables to the hospital.  [facility] is not responsible for any belongings or valuables.  Contacts, dentures or bridgework may not be worn into surgery.  Leave your suitcase in the car.  After surgery it may be brought to your room.  For patients admitted to the hospital, discharge time will be determined by your treatment team.  Patients discharged the day of surgery will not be allowed to drive home.   Name and phone number of your driver:   family Special instructions:  none  Please read over the following fact sheets that you were given. Pain Booklet, Coughing and Deep Breathing, Surgical Site Infection Prevention, Anesthesia Post-op Instructions and Care and Recovery After Surgery      Endoscopic Retrograde Cholangiopancreatography (ERCP) Endoscopic retrograde cholangiopancreatography (ERCP) is a procedure used to diagnosis many diseases of the pancreas, bile ducts, liver, and gallbladder. During ERCP a thin, lighted tube (endoscope) is passed through the mouth and down the back of the throat into the first part of the small intestine (duodenum). A small, plastic tube (cannula) is then passed through the endoscope and directed into the bile duct or pancreatic duct. Dye is then injected through the cannula and X-rays are taken to study the biliary and pancreatic passageways.  LET [facility]  CARE PROVIDER KNOW ABOUT:   Any allergies you have.   All medicines you are taking, including vitamins, herbs, eyedrops, creams, and over-the-counter medicines.   Previous problems you or members of your family have had with the use of anesthetics.   Any blood disorders you have.   Previous surgeries you have had.   Medical conditions you have. RISKS AND COMPLICATIONS Generally, ERCP is a safe procedure. However, as with any procedure, complications can occur. A simple removal of gallstones has the lowest rate of complications. Higher rates of complication occur in people who have poorly functioning bile or pancreatic ducts. Possible complications include:   Pancreatitis.  Bleeding.  Accidental punctures in the bowel wall, pancreas, or gall bladder.  Gall bladder or bile duct infection. BEFORE THE PROCEDURE   Do not eat or drink anything, including water, for at least 8 hours before the procedure or as directed by your health care provider.   Ask your health care provider whether you should stop taking certain medicines prior to your procedure.   Arrange for someone to drive you home. You will not be allowed to drive for 32-12 hours after the procedure. PROCEDURE   You will be given medicine through a vein (intravenously) to make you relaxed and sleepy.   You might have a breathing tube placed to give you medicine that makes you sleep (general anesthetic).   Your throat may be sprayed with medicine that numbs the area and prevents gagging (local anesthetic), or you  may gargle this medicine.   You will lie on your left side.   The endoscope will be inserted through your mouth and into the duodenum. The tube will not interfere with your breathing. Gagging is prevented by the anesthesia.   While X-rays are being taken, you may be positioned on your stomach.   A small sample of tissue (biopsy) may be removed for examination. AFTER THE PROCEDURE   You will rest  in bed until you are fully conscious.   When you first wake up, your throat may feel slightly sore.   You will not be allowed to eat or drink until numbness subsides.   Once you are able to drink, urinate, and sit on the edge of the bed without feeling sick to your stomach (nauseous) or dizzy, you may be allowed to go home. Document Released: 09/21/2000 Document Revised: 10/17/2012 Document Reviewed: 08/07/2012 [facility] Patient Information 2015 [place], [place]. This information is not intended to replace advice given to you by your health care provider. Make sure you discuss any questions you have with your health care provider. PATIENT INSTRUCTIONS POST-ANESTHESIA  IMMEDIATELY FOLLOWING SURGERY:  Do not drive or operate machinery for the first twenty four hours after surgery.  Do not make any important decisions for twenty four hours after surgery or while taking narcotic pain medications or sedatives.  If you develop intractable nausea and vomiting or a severe headache please notify your doctor immediately.  FOLLOW-UP:  Please make an appointment with your surgeon as instructed. You do not need to follow up with anesthesia unless specifically instructed to do so.  WOUND CARE INSTRUCTIONS (if applicable):  Keep a dry clean dressing on the anesthesia/puncture wound site if there is drainage.  Once the wound has quit draining you may leave it open to air.  Generally you should leave the bandage intact for twenty four hours unless there is drainage.  If the epidural site drains for more than 36-48 hours please call the anesthesia department.  QUESTIONS?:  Please feel free to call your physician or the hospital operator if you have any questions, and they will be happy to assist you.

## 2014-08-07 NOTE — OR Nursing (Signed)
Hgb  9.1 and Hct  27.8,  Reported to dr. Patsey Berthold,  No further orders needed.

## 2014-08-08 ENCOUNTER — Ambulatory Visit (HOSPITAL_COMMUNITY): Payer: Medicare Other | Admitting: Anesthesiology

## 2014-08-08 ENCOUNTER — Ambulatory Visit (HOSPITAL_COMMUNITY): Payer: Medicare Other

## 2014-08-08 ENCOUNTER — Encounter (HOSPITAL_COMMUNITY)
Admission: RE | Disposition: A | Discharge status: Home / Self Care | Payer: Self-pay | Source: Ambulatory Visit | Attending: Internal Medicine

## 2014-08-08 ENCOUNTER — Encounter (HOSPITAL_COMMUNITY): Payer: Self-pay | Admitting: *Deleted

## 2014-08-08 ENCOUNTER — Ambulatory Visit (HOSPITAL_COMMUNITY)
Admission: RE | Admit: 2014-08-08 | Discharge: 2014-08-08 | Disposition: A | Discharge status: Home / Self Care | Payer: Medicare Other | Source: Ambulatory Visit | Attending: Internal Medicine | Admitting: Internal Medicine

## 2014-08-08 DIAGNOSIS — C259 Malignant neoplasm of pancreas, unspecified: Secondary | ICD-10-CM

## 2014-08-08 DIAGNOSIS — E039 Hypothyroidism, unspecified: Secondary | ICD-10-CM | POA: Insufficient documentation

## 2014-08-08 DIAGNOSIS — C7800 Secondary malignant neoplasm of unspecified lung: Secondary | ICD-10-CM | POA: Diagnosis not present

## 2014-08-08 DIAGNOSIS — Z01810 Encounter for preprocedural cardiovascular examination: Secondary | ICD-10-CM | POA: Insufficient documentation

## 2014-08-08 DIAGNOSIS — C787 Secondary malignant neoplasm of liver and intrahepatic bile duct: Secondary | ICD-10-CM | POA: Insufficient documentation

## 2014-08-08 DIAGNOSIS — K831 Obstruction of bile duct: Secondary | ICD-10-CM | POA: Insufficient documentation

## 2014-08-08 DIAGNOSIS — F419 Anxiety disorder, unspecified: Secondary | ICD-10-CM | POA: Insufficient documentation

## 2014-08-08 DIAGNOSIS — R17 Unspecified jaundice: Secondary | ICD-10-CM | POA: Diagnosis not present

## 2014-08-08 DIAGNOSIS — Z09 Encounter for follow-up examination after completed treatment for conditions other than malignant neoplasm: Secondary | ICD-10-CM

## 2014-08-08 DIAGNOSIS — K315 Obstruction of duodenum: Secondary | ICD-10-CM | POA: Diagnosis not present

## 2014-08-08 DIAGNOSIS — Z01812 Encounter for preprocedural laboratory examination: Secondary | ICD-10-CM | POA: Insufficient documentation

## 2014-08-08 HISTORY — PX: ERCP: SHX5425

## 2014-08-08 HISTORY — PX: SPHINCTEROTOMY: SHX5544

## 2014-08-08 HISTORY — PX: BILIARY STENT PLACEMENT: SHX5538

## 2014-08-08 SURGERY — ERCP, WITH INTERVENTION IF INDICATED
Anesthesia: General

## 2014-08-08 MED ORDER — DIPHENHYDRAMINE HCL 50 MG/ML IJ SOLN
25.0000 mg | Freq: Once | INTRAMUSCULAR | Status: AC
  Administered 2014-08-08: 25 mg via INTRAVENOUS

## 2014-08-08 MED ORDER — PROPOFOL 10 MG/ML IV BOLUS
INTRAVENOUS | Status: AC
  Filled 2014-08-08: qty 20

## 2014-08-08 MED ORDER — FENTANYL CITRATE (PF) 100 MCG/2ML IJ SOLN: 25.0000 ug | INTRAMUSCULAR | Status: DC | PRN

## 2014-08-08 MED ORDER — SODIUM CHLORIDE 0.9 % IV SOLN
INTRAVENOUS | Status: DC | PRN
  Administered 2014-08-08: 08:00:00

## 2014-08-08 MED ORDER — FENTANYL CITRATE (PF) 100 MCG/2ML IJ SOLN
INTRAMUSCULAR | Status: DC | PRN
  Administered 2014-08-08: 25 ug via INTRAVENOUS

## 2014-08-08 MED ORDER — CEFAZOLIN SODIUM-DEXTROSE 2-3 GM-% IV SOLR
2.0000 g | Freq: Once | INTRAVENOUS | Status: AC
  Administered 2014-08-08: 2 g via INTRAVENOUS

## 2014-08-08 MED ORDER — STERILE WATER FOR IRRIGATION IR SOLN
Status: DC | PRN
  Administered 2014-08-08: 08:00:00

## 2014-08-08 MED ORDER — MIDAZOLAM HCL 2 MG/2ML IJ SOLN
INTRAMUSCULAR | Status: AC
  Filled 2014-08-08: qty 2

## 2014-08-08 MED ORDER — ONDANSETRON HCL 4 MG/2ML IJ SOLN: 4.0000 mg | Freq: Once | INTRAMUSCULAR | Status: DC | PRN

## 2014-08-08 MED ORDER — FENTANYL CITRATE (PF) 100 MCG/2ML IJ SOLN
INTRAMUSCULAR | Status: AC
  Filled 2014-08-08: qty 2

## 2014-08-08 MED ORDER — PROPOFOL 10 MG/ML IV BOLUS
INTRAVENOUS | Status: DC | PRN
  Administered 2014-08-08: 110 mg via INTRAVENOUS

## 2014-08-08 MED ORDER — METHYLPREDNISOLONE SODIUM SUCC 125 MG IJ SOLR
125.0000 mg | Freq: Once | INTRAMUSCULAR | Status: AC
  Administered 2014-08-08: 125 mg via INTRAVENOUS

## 2014-08-08 MED ORDER — ROCURONIUM BROMIDE 100 MG/10ML IV SOLN
INTRAVENOUS | Status: DC | PRN
  Administered 2014-08-08: 20 mg via INTRAVENOUS
  Administered 2014-08-08: 10 mg via INTRAVENOUS

## 2014-08-08 MED ORDER — METHYLPREDNISOLONE SODIUM SUCC 125 MG IJ SOLR
INTRAMUSCULAR | Status: AC
  Filled 2014-08-08: qty 2

## 2014-08-08 MED ORDER — ONDANSETRON HCL 4 MG/2ML IJ SOLN
4.0000 mg | Freq: Once | INTRAMUSCULAR | Status: AC
  Administered 2014-08-08: 4 mg via INTRAVENOUS

## 2014-08-08 MED ORDER — LIDOCAINE HCL (PF) 1 % IJ SOLN
INTRAMUSCULAR | Status: AC
  Filled 2014-08-08: qty 5

## 2014-08-08 MED ORDER — GLYCOPYRROLATE 0.2 MG/ML IJ SOLN
INTRAMUSCULAR | Status: DC | PRN
  Administered 2014-08-08: 0.2 mg via INTRAVENOUS

## 2014-08-08 MED ORDER — SODIUM CHLORIDE 0.9 % IV SOLN
INTRAVENOUS | Status: AC
  Filled 2014-08-08: qty 50

## 2014-08-08 MED ORDER — DIPHENHYDRAMINE HCL 50 MG/ML IJ SOLN
INTRAMUSCULAR | Status: AC
  Filled 2014-08-08: qty 1

## 2014-08-08 MED ORDER — GLUCAGON HCL RDNA (DIAGNOSTIC) 1 MG IJ SOLR
INTRAMUSCULAR | Status: AC
  Administered 2014-08-08 (×2): 0.25 mg via INTRAVENOUS
  Filled 2014-08-08: qty 2

## 2014-08-08 MED ORDER — ROCURONIUM BROMIDE 50 MG/5ML IV SOLN
INTRAVENOUS | Status: AC
  Filled 2014-08-08: qty 1

## 2014-08-08 MED ORDER — MIDAZOLAM HCL 2 MG/2ML IJ SOLN
1.0000 mg | INTRAMUSCULAR | Status: DC | PRN
  Administered 2014-08-08 (×2): 1 mg via INTRAVENOUS

## 2014-08-08 MED ORDER — LACTATED RINGERS IV SOLN
INTRAVENOUS | Status: DC
  Administered 2014-08-08: 1000 mL via INTRAVENOUS

## 2014-08-08 MED ORDER — CEFAZOLIN SODIUM-DEXTROSE 2-3 GM-% IV SOLR
INTRAVENOUS | Status: AC
  Filled 2014-08-08: qty 50

## 2014-08-08 MED ORDER — ONDANSETRON HCL 4 MG/2ML IJ SOLN
INTRAMUSCULAR | Status: AC
  Filled 2014-08-08: qty 2

## 2014-08-08 MED ORDER — NEOSTIGMINE METHYLSULFATE 10 MG/10ML IV SOLN
INTRAVENOUS | Status: DC | PRN
  Administered 2014-08-08: 2 mg via INTRAVENOUS

## 2014-08-08 MED ORDER — LIDOCAINE HCL 1 % IJ SOLN
INTRAMUSCULAR | Status: DC | PRN
  Administered 2014-08-08: 20 mg via INTRADERMAL

## 2014-08-08 SURGICAL SUPPLY — 23 items
ADVANIX BILIARY DUODENAL BEND-BILIARY STENT ×3 IMPLANT
BAG HAMPER (MISCELLANEOUS) ×3 IMPLANT
BALLN RETRIEVAL 12X15 (BALLOONS) IMPLANT
BALLN RETRIEVAL 12X15MM (BALLOONS)
BASKET TRAPEZOID 3X6 (MISCELLANEOUS) IMPLANT
DEVICE INFLATION ENCORE 26 (MISCELLANEOUS) IMPLANT
DEVICE LOCKING W-BIOPSY CAP (MISCELLANEOUS) ×3 IMPLANT
GUIDEWIRE HYDRA JAGWIRE .35 (WIRE) IMPLANT
GUIDEWIRE JAG HINI 025X260CM (WIRE) IMPLANT
KIT ENDO PROCEDURE PEN (KITS) ×3 IMPLANT
KIT ROOM TURNOVER APOR (KITS) ×3 IMPLANT
LUBRICANT JELLY 4.5OZ STERILE (MISCELLANEOUS) ×3 IMPLANT
PAD ARMBOARD 7.5X6 YLW CONV (MISCELLANEOUS) ×3 IMPLANT
PATHFINDER 450CM 0.18 (STENTS) IMPLANT
POSITIONER HEAD 8X9X4 ADT (SOFTGOODS) IMPLANT
SNARE ROTATE MED OVAL 20MM (MISCELLANEOUS) IMPLANT
SNARE SHORT THROW 13M SML OVAL (MISCELLANEOUS) IMPLANT
SPHINCTEROTOME AUTOTOME .25 (MISCELLANEOUS) IMPLANT
SPHINCTEROTOME HYDRATOME 44 (MISCELLANEOUS) ×6 IMPLANT
SYSTEM CONTINUOUS INJECTION (MISCELLANEOUS) ×3 IMPLANT
WALLSTENT METAL COVERED 10X60 (STENTS) IMPLANT
WALLSTENT METAL COVERED 10X80 (STENTS) IMPLANT
WATER STERILE IRR 1000ML POUR (IV SOLUTION) ×3 IMPLANT

## 2014-08-08 NOTE — Progress Notes (Signed)
To xray for upright abdomen film at discharge.

## 2014-08-08 NOTE — Anesthesia Postprocedure Evaluation (Signed)
  Anesthesia Post-op Note  Patient: Katherine Ferguson  Procedure(s) Performed: Procedure(s): ENDOSCOPIC RETROGRADE CHOLANGIOPANCREATOGRAPHY (ERCP) (N/A) BILIARY STENT PLACEMENT (N/A) SPHINCTEROTOMY (N/A)  Patient Location: PACU  Anesthesia Type:General  Level of Consciousness: awake, alert , oriented and patient cooperative  Airway and Oxygen Therapy: Patient Spontanous Breathing and Patient connected to face mask oxygen  Post-op Pain: none  Post-op Assessment: Post-op Vital signs reviewed, Patient's Cardiovascular Status Stable, Respiratory Function Stable, Patent Airway, No signs of Nausea or vomiting and Pain level controlled              Post-op Vital Signs: Reviewed and stable  Last Vitals:  Filed Vitals:   08/08/14 0945  BP: 131/68  Pulse: 89  Temp:   Resp: 17    Complications: No apparent anesthesia complications

## 2014-08-08 NOTE — Discharge Instructions (Signed)
Resume usual medications. Clear liquids today and then usual diet starting in a.m. Follow-up with Dr. Tressie Stalker next week as planned. No driving for 24 hours.

## 2014-08-08 NOTE — H&P (Signed)
Katherine Ferguson is an 64 y.o. female.   Chief Complaint: Patient is here for ERCP and biliary stenting. HPI: Patient is an unfortunate 64 year old African-American female was metastatic pancreatic carcinoma and now presents with jaundice. She was noted to be jaundiced earlier this week by her daughter. She also has noted change in the color of her urine. She has not experienced nausea vomiting fever or chills. She was seen by Dr. Tressie Stalker, oncologist 3 days ago and noted to have elevated bilirubin and alkaline phosphatase along with transaminases. Upper abdominal ultrasound was obtained which revealed bile duct of over 12 mm, distended gallbladder and multiple hepatic lesions. Patient is therefore here for biliary stenting.  Past Medical History  Diagnosis Date  . Hypothyroidism   . Anxiety     rare  . Pain     lower back and rib cage area, sitting or moving  . Shingles 2015  . Graves disease   . GERD (gastroesophageal reflux disease)   . Arthritis   . Dysrhythmia     tachycardia peroids during dehydration  . Cancer     lung-dx. 2014, active chemo, last 11-12-13,every 2 weeks; Mets to pancreas and liver.  . Chronic diarrhea     Past Surgical History  Procedure Laterality Date  . Abdominal hysterectomy    . Tonsillectomy    . Video assisted thoracoscopy (vats)/wedge resection Right 11/2012  . Mass removed  from back and neck      "fatty tumors"  . Colonoscopy N/A 10/11/2013    Procedure: COLONOSCOPY;  Surgeon: Rogene Houston, MD;  Location: [facility];  Service: Endoscopy;  Laterality: N/A;  1240  . Esophagogastroduodenoscopy N/A 10/11/2013    Procedure: ESOPHAGOGASTRODUODENOSCOPY (EGD);  Surgeon: Rogene Houston, MD;  Location: [facility];  Service: Endoscopy;  Laterality: N/A;  . Portacath placement Right     2'15 Dr. Kathlene Cote  . Eus N/A 11/21/2013    Procedure: UPPER ENDOSCOPIC ULTRASOUND (EUS) LINEAR;  Surgeon: Milus Banister, MD;  Location: [facility];  Service:  Endoscopy;  Laterality: N/A;    History reviewed. No pertinent family history. Social History:  reports that she has never smoked. She has never used smokeless tobacco. She reports that she does not drink alcohol or use illicit drugs.  Allergies:  Allergies  Allergen Reactions  . Iodine   . Iodides Rash    Medications Prior to Admission  Medication Sig Dispense Refill  . benzonatate (TESSALON) 100 MG capsule Take 100 mg by mouth 3 (three) times daily as needed.   3  . calcium carbonate (OS-CAL) 600 MG TABS tablet Take 600 mg by mouth 3 (three) times daily with meals.     . Cholecalciferol (VITAMIN D) 2000 UNITS tablet Take 2,000 Units by mouth daily with lunch.     . diphenoxylate-atropine (LOMOTIL) 2.5-0.025 MG per tablet Take 1 tablet by mouth 3 (three) times daily before meals. (Patient taking differently: Take 1 tablet by mouth 3 (three) times daily as needed for diarrhea or loose stools. ) 90 tablet 3  . folic acid (FOLVITE) 1 MG tablet Take 1 mg by mouth daily.  11  . loperamide (IMODIUM) 2 MG capsule TAKE ONE CAPSULE BY MOUTH TWICE A DAY 60 capsule 5  . LORazepam (ATIVAN) 0.5 MG tablet Take 0.5 mg by mouth every 8 (eight) hours as needed for anxiety.    . megestrol (MEGACE) 40 MG/ML suspension 3 (three) times daily. Patient states that she takes on as needed basis  12  .  methimazole (TAPAZOLE) 10 MG tablet Take 2.5 mg by mouth every morning.     . metoprolol (LOPRESSOR) 50 MG tablet Take 50 mg by mouth every morning.     . Multiple Vitamin (THERA) TABS Take 1 tablet by mouth daily at 12 noon.     . Octreotide Acetate 200 MCG/ML SOLN Inject 0.25 mLs (50 mcg total) as directed 3 (three) times daily. (Patient taking differently: Inject 100 mcg as directed 2 (two) times daily. ) 45 mL 1  . oxycodone (OXY-IR) 5 MG capsule Take 5-10 mg by mouth every 4 (four) hours as needed for pain.    . Pancrelipase, Lip-Prot-Amyl, 24000 UNITS CPEP 4 capsules by mouth each meal and one with snack.  (Patient taking differently: Take 2-4 capsules by mouth 4 (four) times daily. 4 capsules by mouth each meal and two with snacks) 420 capsule 5  . pantoprazole (PROTONIX) 40 MG tablet Take 1 tablet (40 mg total) by mouth daily before breakfast. (Patient taking differently: Take 40 mg by mouth 2 (two) times daily. ) 30 tablet 5  . potassium chloride SA (K-DUR,KLOR-CON) 20 MEQ tablet Take 20 mEq by mouth 3 (three) times daily.      Results for orders placed or performed during the hospital encounter of 08/06/14 (from the past 48 hour(s))  CBC with Differential/Platelet     Status: Abnormal   Collection Time: 08/06/14  3:00 PM  Result Value Ref Range   WBC 19.8 (H) 4.0 - 10.5 K/uL   RBC 3.05 (L) 3.87 - 5.11 MIL/uL   Hemoglobin 9.1 (L) 12.0 - 15.0 g/dL   HCT 27.8 (L) 36.0 - 46.0 %   MCV 91.1 78.0 - 100.0 fL   MCH 29.8 26.0 - 34.0 pg   MCHC 32.7 30.0 - 36.0 g/dL   RDW 18.6 (H) 11.5 - 15.5 %   Platelets 236 150 - 400 K/uL   Neutrophils Relative % 87 (H) 43 - 77 %   Lymphocytes Relative 9 (L) 12 - 46 %   Monocytes Relative 3 3 - 12 %   Eosinophils Relative 1 0 - 5 %   Basophils Relative 0 0 - 1 %   Neutro Abs 17.2 (H) 1.7 - 7.7 K/uL   Lymphs Abs 1.8 0.7 - 4.0 K/uL   Monocytes Absolute 0.6 0.1 - 1.0 K/uL   Eosinophils Absolute 0.2 0.0 - 0.7 K/uL   Basophils Absolute 0.0 0.0 - 0.1 K/uL   WBC Morphology INCREASED BANDS (>20% BANDS)   Comprehensive metabolic panel     Status: Abnormal   Collection Time: 08/06/14  3:00 PM  Result Value Ref Range   Sodium 138 135 - 145 mmol/L   Potassium 3.6 3.5 - 5.1 mmol/L   Chloride 105 101 - 111 mmol/L   CO2 24 22 - 32 mmol/L   Glucose, Bld 95 65 - 99 mg/dL   BUN 7 6 - 20 mg/dL   Creatinine, Ser 0.61 0.44 - 1.00 mg/dL   Calcium 7.4 (L) 8.9 - 10.3 mg/dL   Total Protein 6.1 (L) 6.5 - 8.1 g/dL   Albumin 3.2 (L) 3.5 - 5.0 g/dL   AST 165 (H) 15 - 41 U/L   ALT 190 (H) 14 - 54 U/L   Alkaline Phosphatase 722 (H) 38 - 126 U/L   Total Bilirubin 6.1 (H) 0.3 -  1.2 mg/dL   GFR calc non Af Amer >60 >60 mL/min   GFR calc Af Amer >60 >60 mL/min    Comment: (NOTE) The  eGFR has been calculated using the CKD EPI equation. This calculation has not been validated in all clinical situations. eGFR's persistently <60 mL/min signify possible Chronic Kidney Disease.    Anion gap 9 5 - 15   No results found.  ROS  Blood pressure 112/61, temperature 98 F (36.7 C), temperature source Oral, resp. rate 18, height 5' 2"  (1.575 m), weight 103 lb (46.72 kg), SpO2 98 %. Physical Exam  Constitutional:  Well-developed thin African-American female in NAD.  HENT:  Mouth/Throat: Oropharynx is clear and moist.  Eyes: Scleral icterus is present.  Neck: No thyromegaly present.  Cardiovascular: Normal rate, regular rhythm and normal heart sounds.   No murmur heard. Respiratory: Effort normal and breath sounds normal.  GI:  Abdomen is flat. Sof subcutaneous mass about the size of golf all noted at right low quadrant close to anterior iliac spine. Liver edge is firm about 5 cm below RCM.   Musculoskeletal: She exhibits no edema.  Lymphadenopathy:    She has no cervical adenopathy.  Neurological: She is alert.  Skin: Skin is warm and dry.     Assessment/Plan Obstructive jaundice secondary to bile duct obstruction due to pancreatic adenocarcinoma which is also metastatic to liver and lung. ERCP with biliary stenting. Given underlying diagnosis will proceed with wall stenting. Given diagnosis covered wall stent would be placed. Procedure and risks reviewed with the patient her husband and their daughter and they're all agreeable.  [name],[name] 08/08/2014, 7:23 AM

## 2014-08-08 NOTE — Op Note (Signed)
ERCP PROCEDURE REPORT  PATIENT:  Katherine Ferguson  MR#:  [id number] Birthdate:  1950/07/01, 64 y.o., female Endoscopist:  Dr. Rogene Houston, MD Referred By:  Dr. Everardo All, MD Procedure Date: 08/08/2014  Procedure:   ERCP    Indications:  Patient is 64 year old African-American female who has known metastatic pancreatic carcinoma and now presents with obstructive jaundice. She has hepatic metastatic disease or jaundice as felt to be due to CBD obstruction well documented on ultrasonography.            Informed Consent:  The risks, benefits, limitations, alternatives, and mponderable have been reviewed with the patient. I specifically discussed a 1 in 10 chance of pancreatitis, reaction to medications, bleeding, perforation and the possibility of a failed ERCP. Potential for sphincterotomy and stent placement also reviewed. Questions have been answered. All parties agreeable.  Please see history & physical in medical record for more information.  Medications: Gen. endotracheal anesthesia.  Please see anesthesia record for complete details  Description of procedure:  Procedure performed in the OR. The patient was placed under anesthesia, intubated, and turned into semipermanent position. Therapeutic Pentax video duodenoscope passed through the oropharynx without any difficulty into the esophagus, stomach, and across the pylorus and pull, and descending duodenum.  Duodenoscope could not be passed into second part of duodenum because of narrowing at angle of duodenum. This segment was evaluated with Q-scope. Narrowing felt to be due to extrinsic pancreatic mass. Duodenoscope subsequently passed across this area with some difficulty. Cannulation performed with Rx 44 autotome and 035 hhdrajagwire.   Findings:  Noncritical narrowing at angle of duodenum secondary to extrinsic mass involving pancreas. Normal ampulla of Vater. Long high-grade stricture involving CBD. Markedly dilated common  hepatic and cystic duct. Guidewire could not be advanced into common hepatic duct. 10 French 7 cm long plastic biliary stent placed into dilated cystic duct. As the stent was released there was free flow of bile into duodenum.   Therapeutic/Diagnostic Maneuvers Performed:  See above.   Complications:  None  Impression:  Noncritical narrowing at angle of duodenum secondary to extrinsic pancreatic mass. High grade CBD stricture with dilated cystic duct, GB and common hepatic duct. 10 French 7 cm long plastic stent placed into the cystic duct for biliary decompression.  Recommendations:  Clear liquids today. Patient will have LFTs next week at the time of office visit with dr. Tressie Stalker. Will consider repeat ERCP within the next few weeks in order to change stent.  Xiong Haidar U  08/08/2014  9:38 AM  CC: Dr. Manon Hilding, MD & Dr. Rayne Du ref. provider found

## 2014-08-08 NOTE — Anesthesia Procedure Notes (Signed)
Procedure Name: Intubation Date/Time: 08/08/2014 7:48 AM Performed by: Charmaine Downs Pre-anesthesia Checklist: Emergency Drugs available, Suction available, Patient being monitored and Patient identified Patient Re-evaluated:Patient Re-evaluated prior to inductionOxygen Delivery Method: Circle system utilized Preoxygenation: Pre-oxygenation with 100% oxygen Intubation Type: IV induction Ventilation: Mask ventilation without difficulty Laryngoscope Size: Mac and 3 Grade View: Grade II Tube type: Oral Tube size: 7.0 mm Number of attempts: 1 Airway Equipment and Method: Stylet Placement Confirmation: ETT inserted through vocal cords under direct vision,  breath sounds checked- equal and bilateral and positive ETCO2 Secured at: 22 cm Tube secured with: Tape Dental Injury: Teeth and Oropharynx as per pre-operative assessment

## 2014-08-08 NOTE — Transfer of Care (Signed)
Immediate Anesthesia Transfer of Care Note  Patient: Katherine Ferguson  Procedure(s) Performed: Procedure(s): ENDOSCOPIC RETROGRADE CHOLANGIOPANCREATOGRAPHY (ERCP) (N/A) BILIARY STENT PLACEMENT (N/A) SPHINCTEROTOMY (N/A)  Patient Location: PACU  Anesthesia Type:General  Level of Consciousness: awake  Airway & Oxygen Therapy: Patient Spontanous Breathing and Patient connected to face mask oxygen  Post-op Assessment: Report given to RN, Post -op Vital signs reviewed and stable and Patient moving all extremities  Post vital signs: Reviewed and stable  Last Vitals:  Filed Vitals:   08/08/14 0730  BP: 138/73  Temp:   Resp: 17    Complications: No apparent anesthesia complications

## 2014-08-08 NOTE — Anesthesia Preprocedure Evaluation (Signed)
Anesthesia Evaluation  Patient identified by MRN, date of birth, ID band Patient awake    Reviewed: Allergy & Precautions, H&P , NPO status , Patient's Chart, lab work & pertinent test results, reviewed documented beta blocker date and time   Airway Mallampati: II  TM Distance: >3 FB Neck ROM: Full    Dental no notable dental hx.    Pulmonary neg pulmonary ROS,  breath sounds clear to auscultation  Pulmonary exam normal       Cardiovascular Normal cardiovascular exam+ dysrhythmias Rhythm:Regular Rate:Normal     Neuro/Psych PSYCHIATRIC DISORDERS Anxiety negative neurological ROS     GI/Hepatic negative GI ROS, Neg liver ROS, GERD-  Medicated and Controlled,  Endo/Other  Hypothyroidism Hyperthyroidism   Renal/GU negative Renal ROS     Musculoskeletal negative musculoskeletal ROS (+)   Abdominal   Peds  Hematology negative hematology ROS (+)   Anesthesia Other Findings   Reproductive/Obstetrics negative OB ROS                             Anesthesia Physical Anesthesia Plan  ASA: III  Anesthesia Plan: General   Post-op Pain Management:    Induction: Intravenous  Airway Management Planned: Oral ETT  Additional Equipment:   Intra-op Plan:   Post-operative Plan: Extubation in OR  Informed Consent: I have reviewed the patients History and Physical, chart, labs and discussed the procedure including the risks, benefits and alternatives for the proposed anesthesia with the patient or authorized representative who has indicated his/her understanding and acceptance.     Plan Discussed with:   Anesthesia Plan Comments:         Anesthesia Quick Evaluation

## 2014-08-11 ENCOUNTER — Encounter (HOSPITAL_COMMUNITY): Payer: Self-pay | Admitting: Internal Medicine

## 2014-08-18 ENCOUNTER — Telehealth (INDEPENDENT_AMBULATORY_CARE_PROVIDER_SITE_OTHER): Payer: Self-pay | Admitting: *Deleted

## 2014-08-18 NOTE — Telephone Encounter (Signed)
Per Dr.[name] - Increase it to 3 per day, and also if the cancer center will order the Sandostatin LA it may not be a charge for the medication. We have samples of Zenpep 40 ,000 units and 20,000 units. Patient may take the 40,000 units after each meal and 1 20,000 unit after snacks.  Patient was called and made aware.

## 2014-08-18 NOTE — Telephone Encounter (Signed)
Patient states that she saw Dr/Parson's when in the hospital. She is injecting the Sandostatin twice daily. She is seeing some consistency in the stool and is going about 4 times daily,depending on the foods that she has eaten. She has 5 days left on hand of the injections. Diarrhea is worse on days that she has the Chemo.  Questions: Does she refill the Sandostatin ?                     Leave the injections to 2 daily?                    Or is it going to be increased to 3 injections daily?  Patient states that she has only 4 days of Creon on hand. She is asking about samples. We will check and let her know this and Dr.[name]'s recommendation.

## 2014-08-18 NOTE — Telephone Encounter (Signed)
Correction the patient will inject 1 Sandostatin LA on a weekly basis, as this is long acting. Since we have talked with the patient , samples of Creon have come in , and per NUR give them to patient. She is going to talk with oncology nurse about this.

## 2014-08-18 NOTE — Telephone Encounter (Signed)
Katherine Ferguson would like to speak with Dr. Laural Golden about the shots she is taking for diarrhea. Her return phone number is [phone].

## 2014-09-08 ENCOUNTER — Other Ambulatory Visit (INDEPENDENT_AMBULATORY_CARE_PROVIDER_SITE_OTHER): Payer: Self-pay | Admitting: Internal Medicine

## 2014-09-08 ENCOUNTER — Telehealth (INDEPENDENT_AMBULATORY_CARE_PROVIDER_SITE_OTHER): Payer: Self-pay | Admitting: *Deleted

## 2014-09-08 MED ORDER — FENTANYL 25 MCG/HR TD PT72: 25.0000 ug | MEDICATED_PATCH | TRANSDERMAL | Status: AC

## 2014-09-08 NOTE — Telephone Encounter (Signed)
Katherine Ferguson has called back to see if Katherine Ferguson or Dr. Laural Ferguson would call. They have not heard back. Dr. Tressie Stalker was to talk with Dr. Laural Ferguson on Wednesday of last week, 09/03/14. Katherine Ferguson is short of breath and more abd pain. She also looks like she is about 4 to 5 months pregant, not able to button her jeans. Tracie, the daughter, feels like her mother is needing to have fluid drawn off. The return phone number is [phone].

## 2014-09-08 NOTE — Telephone Encounter (Signed)
Kammy would like for Dr. Laural Golden to call her daughter, Bevelyn Buckles at [phone]. Please talk with her about her condition and could he please give her his pager number. They have lost it and this is rather important.

## 2014-09-08 NOTE — Telephone Encounter (Signed)
Dr.[name] has talked with Katherine Ferguson , a prescription for Fentyl has been completed. Dr. Laural Golden ask that Katherine Ferguson take her Mother to the Ed at [facility].

## 2014-09-08 NOTE — Telephone Encounter (Signed)
Forwarded to Dr.[name]. 

## 2014-09-10 NOTE — Telephone Encounter (Signed)
I talked with daughter two days ago when patient was advised to go to [facility] emergency room and prescription given for fentanyl patch for pain control as Dr. Tressie Stalker and Dr. Jacquiline Doe were out of the office.

## 2014-10-27 ENCOUNTER — Ambulatory Visit (INDEPENDENT_AMBULATORY_CARE_PROVIDER_SITE_OTHER): Payer: Medicare Other | Admitting: Internal Medicine

## 2016-02-06 IMAGING — DX DG ABDOMEN 1V
1 series · 1 of 1 positions shown · non-contrast
Comparison: None.

CLINICAL DATA: Status post biliary stent placement

EXAM:
ABDOMEN - 1 VIEW

[abdomen supine]
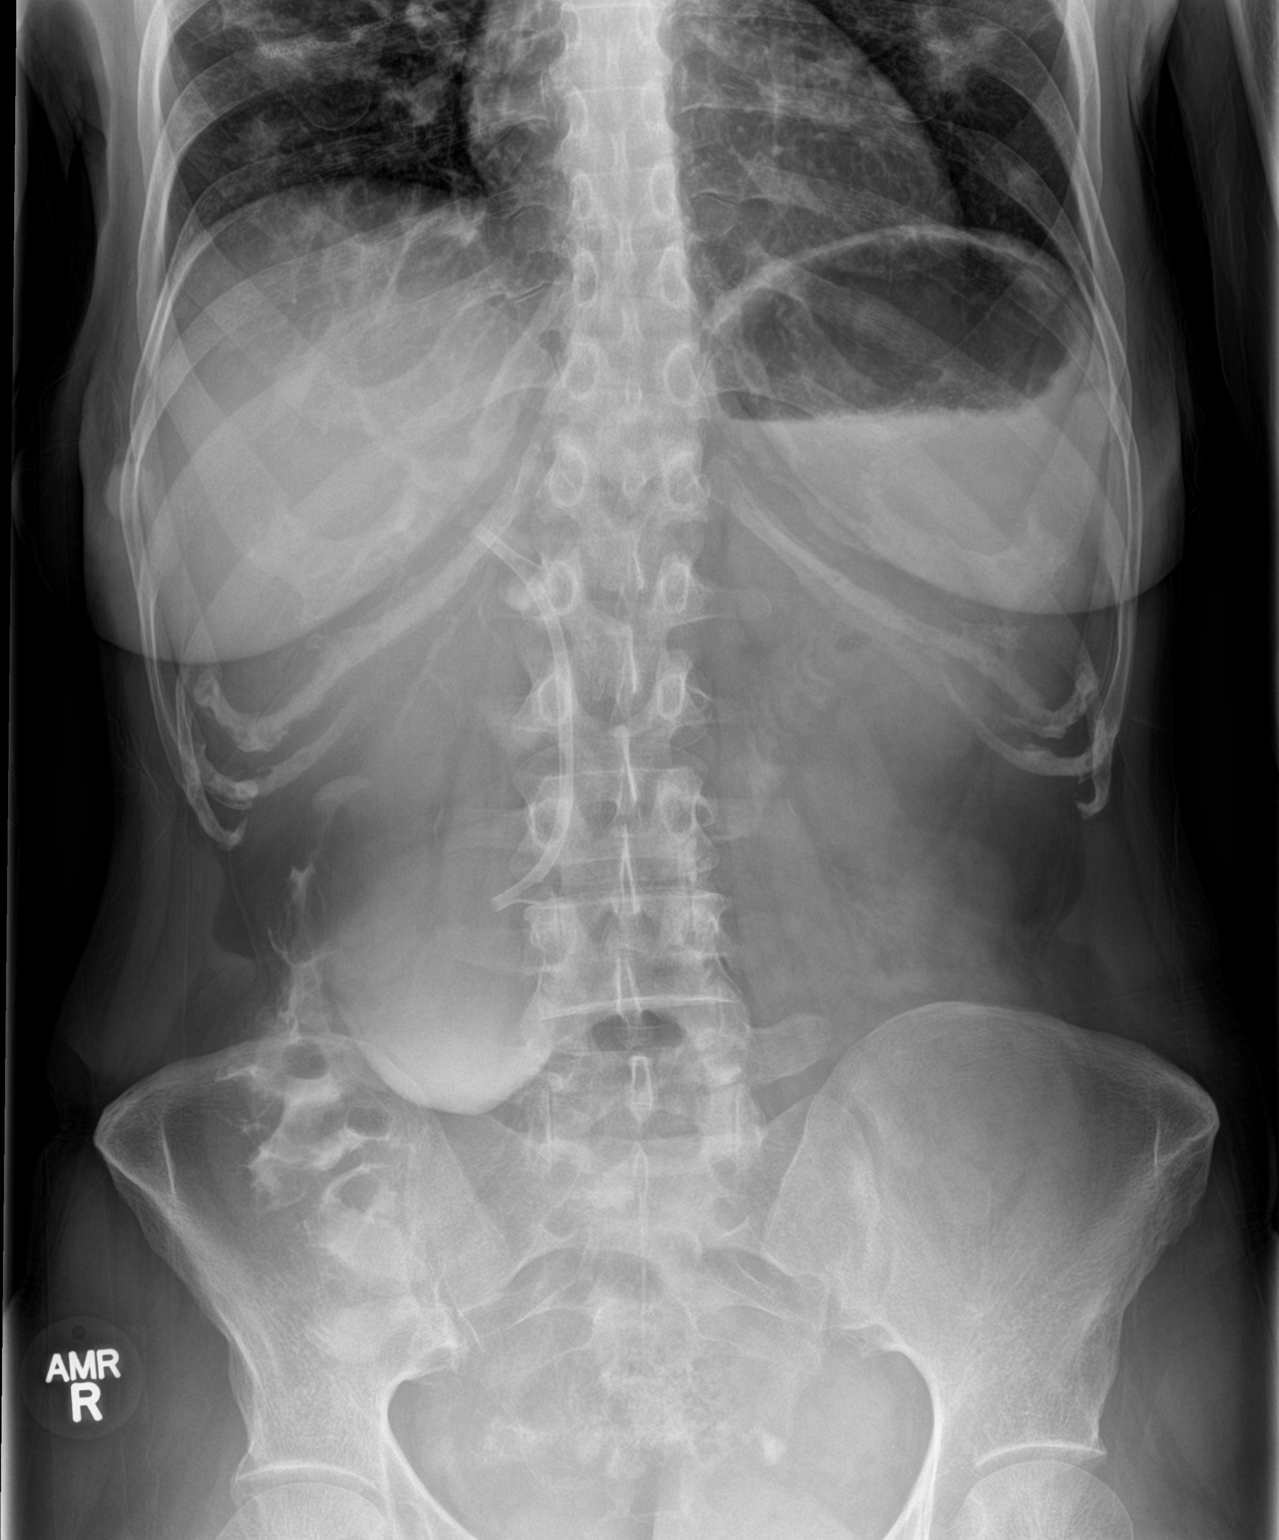

[1 of 1 positions shown; findings below may reference images not displayed]

FINDINGS: Scattered large and small bowel gas is noted. Contrast material is
noted within the colon related to the recent ERCP. Additionally
there is contrast noted in what appears to be the dilated
gallbladder. No obstructive changes are seen. No free air is noted.
Biliary stent is noted in satisfactory position.
IMPRESSION: No acute abnormality noted.  Status post biliary stent placement
